# Patient Record
Sex: Male | Born: 1950 | Race: White | Hispanic: No | Marital: Single | Smoking: Never smoker
Health system: Southern US, Community
[De-identification: ages and names within clinical notes are randomized; demographics above are authoritative.]

## PROBLEM LIST (undated history)

## (undated) DIAGNOSIS — I10 Essential (primary) hypertension: Secondary | ICD-10-CM

## (undated) DIAGNOSIS — F32A Depression, unspecified: Secondary | ICD-10-CM

## (undated) DIAGNOSIS — M1990 Unspecified osteoarthritis, unspecified site: Secondary | ICD-10-CM

## (undated) DIAGNOSIS — F329 Major depressive disorder, single episode, unspecified: Secondary | ICD-10-CM

## (undated) DIAGNOSIS — E7800 Pure hypercholesterolemia, unspecified: Secondary | ICD-10-CM

## (undated) HISTORY — PX: KNEE ARTHROSCOPY: SUR90

---

## 2009-04-18 ENCOUNTER — Ambulatory Visit (HOSPITAL_COMMUNITY): Admission: RE | Admit: 2009-04-18 | Discharge: 2009-04-18 | Payer: Self-pay | Admitting: Orthopaedic Surgery

## 2010-10-31 LAB — DIFFERENTIAL
Basophils Absolute: 0 10*3/uL (ref 0.0–0.1)
Basophils Relative: 1 % (ref 0–1)
Eosinophils Absolute: 0.1 10*3/uL (ref 0.0–0.7)
Neutro Abs: 3 10*3/uL (ref 1.7–7.7)
Neutrophils Relative %: 61 % (ref 43–77)

## 2010-10-31 LAB — COMPREHENSIVE METABOLIC PANEL
ALT: 26 U/L (ref 0–53)
Alkaline Phosphatase: 54 U/L (ref 39–117)
BUN: 19 mg/dL (ref 6–23)
CO2: 29 mEq/L (ref 19–32)
Chloride: 106 mEq/L (ref 96–112)
GFR calc non Af Amer: >60 mL/min (ref >60)
Glucose, Bld: 100 mg/dL — ABNORMAL HIGH (ref 70–99)
Potassium: 4.5 mEq/L (ref 3.5–5.1)
Total Bilirubin: 0.7 mg/dL (ref 0.3–1.2)
Total Protein: 6.9 g/dL (ref 6.0–8.3)

## 2010-10-31 LAB — CBC
HCT: 37.3 % — ABNORMAL LOW (ref 39.0–52.0)
Hemoglobin: 13.4 g/dL (ref 13.0–17.0)
RBC: 4.12 MIL/uL — ABNORMAL LOW (ref 4.22–5.81)
RDW: 12.8 % (ref 11.5–15.5)

## 2010-10-31 LAB — URINALYSIS, ROUTINE W REFLEX MICROSCOPIC
Glucose, UA: NEGATIVE mg/dL
Ketones, ur: NEGATIVE mg/dL
Specific Gravity, Urine: <1.005 — ABNORMAL LOW (ref 1.005–1.030)
pH: 5.5 (ref 5.0–8.0)

## 2016-10-15 ENCOUNTER — Ambulatory Visit (INDEPENDENT_AMBULATORY_CARE_PROVIDER_SITE_OTHER): Payer: Medicare Other | Admitting: Orthopaedic Surgery

## 2016-10-15 ENCOUNTER — Encounter (INDEPENDENT_AMBULATORY_CARE_PROVIDER_SITE_OTHER): Payer: Self-pay | Admitting: Orthopaedic Surgery

## 2016-10-15 VITALS — BP 150/99 | HR 62 | Ht 69.0 in | Wt 215.0 lb

## 2016-10-15 DIAGNOSIS — M1712 Unilateral primary osteoarthritis, left knee: Secondary | ICD-10-CM | POA: Diagnosis not present

## 2016-10-15 NOTE — Progress Notes (Signed)
Office Visit Note   Patient: Joshua BroomsRicky S Reyes           Date of Birth: 07-Oct-1950           MRN: [id number] Visit Date: 10/15/2016              Requested by: No referring provider defined for this encounter. PCP: Ignatius Speckinghruv B Vyas, MD   Assessment & Plan: Visit Diagnoses:  1. Unilateral primary osteoarthritis, left knee     Plan: Patient had multiple questions concerning knee osteoarthritis. Had questions about viscous supplement. We discussed recommended treatments for arthritis including Tylenol weightbearing modification use of cane anti-inflammatories intermittent nice occasional injections and Visco supplementation he is alert had previous knee arthroscopy with partial meniscectomy. We discussed the treatment options and he can call sooner as further questions. Previous knee x-rays show bone-on-bone changes consistent with end-stage primary knee osteoarthritis. Total knee arthroplasty was discussed in detail.  Follow-Up Instructions: We discussed treatment options including total knee arthroplasty. He had multiple questions concerning total knee arthroplasty length of time hospital outpatient physical therapy etc.  Orders:  No orders of the defined types were placed in this encounter.  No orders of the defined types were placed in this encounter.     Procedures: No procedures performed   Clinical Data: No additional findings.   Subjective: Chief Complaint  Patient presents with  . Left Knee - Pain    Patient presents with posterior left knee pain. He states that he was seen in the office a little over a year ago and was told he would eventually need a knee replacement. Since that time, he has been taking tumeric and omega complete and that seems to have helped some with the knee pain and swelling. He is now having what he describes as a "toothache in the knee" pain behind the left knee. He notices this mostly after he works a 4 hour shift at [employer]. He is hoping to put  off surgery and is wondering if this could be due to a Bakers Cyst and if there is something that could be recommended for that.   Patient's had pain swelling this knee with activity. Grinding no locking. No chills or fever. He's been using ice after work on a daily basis.  Review of Systems  Constitutional: Negative for chills and diaphoresis.  HENT: Negative for ear discharge, ear pain and nosebleeds.   Eyes: Negative for discharge and visual disturbance.  Respiratory: Negative for cough, choking and shortness of breath.   Cardiovascular: Negative for chest pain and palpitations.  Gastrointestinal: Negative for abdominal distention and abdominal pain.  Endocrine: Negative for cold intolerance and heat intolerance.  Genitourinary: Negative for flank pain and hematuria.  Musculoskeletal:       Positive for left knee pain with the intermittent swelling pain with activities. He is on his feet working at [employer] has pain by the end of his shift. He describes his as a toothache type pain with aching. Negative for fever or chills.  Skin: Negative for rash and wound.  Neurological: Negative for seizures and speech difficulty.  Hematological: Negative for adenopathy. Does not bruise/bleed easily.  Psychiatric/Behavioral: Negative for agitation and suicidal ideas.     Objective: Vital Signs: BP (!) 150/99   Pulse 62   Ht 5\' 9"  (1.753 m)   Wt 215 lb (97.5 kg)   BMI 31.75 kg/m   Physical Exam  Constitutional: He is oriented to person, place, and time. He appears  well-developed and well-nourished.  HENT:  Head: Normocephalic and atraumatic.  Eyes: EOM are normal. Pupils are equal, round, and reactive to light.  Neck: No tracheal deviation present. No thyromegaly present.  Cardiovascular: Normal rate.   Pulmonary/Chest: Effort normal. He has no wheezes.  Abdominal: Soft. Bowel sounds are normal.  Musculoskeletal:  Tenderness at the medial joint line. Crepitus with knee range of motion  to postnasal effusion. Collateral ligaments crucial ligament exam is normal. Hip range of motion normal negative straight leg raising. Lower extremity reflexes are 2+. Dorsalis pedis posterior tibial pulse is intact. Sensory testing lower extremities is normal. Patient may have small Baker's cyst possibly. Negative Homans. Pes bursa hamstrings are normal.  Neurological: He is alert and oriented to person, place, and time.  Skin: Skin is warm and dry. Capillary refill takes less than 2 seconds.  Psychiatric: He has a normal mood and affect. His behavior is normal. Judgment and thought content normal.    Ortho Exam  Specialty Comments:  No specialty comments available.  Imaging: No results found.   PMFS History: There are no active problems to display for this patient.  No past medical history on file.  No family history on file.  No past surgical history on file. Social History   Occupational History  . Not on file.   Social History Main Topics  . Smoking status: Never Smoker  . Smokeless tobacco: Never Used  . Alcohol use No  . Drug use: No  . Sexual activity: Not on file

## 2016-10-19 ENCOUNTER — Telehealth (INDEPENDENT_AMBULATORY_CARE_PROVIDER_SITE_OTHER): Payer: Self-pay | Admitting: Orthopaedic Surgery

## 2016-10-19 NOTE — Telephone Encounter (Signed)
Tried calling pt to schedule surgery. Phone states he is available and no option to leave VM. Will try to call pt again at a later time.

## 2016-10-20 ENCOUNTER — Telehealth (INDEPENDENT_AMBULATORY_CARE_PROVIDER_SITE_OTHER): Payer: Self-pay | Admitting: Orthopaedic Surgery

## 2016-10-20 NOTE — Telephone Encounter (Signed)
Tried contacting pt again to schedule surgery. Phone does not allow me to leave a VM. Will try pt again at a later time.

## 2016-10-21 ENCOUNTER — Other Ambulatory Visit (INDEPENDENT_AMBULATORY_CARE_PROVIDER_SITE_OTHER): Payer: Self-pay | Admitting: Orthopaedic Surgery

## 2016-10-21 DIAGNOSIS — M1712 Unilateral primary osteoarthritis, left knee: Secondary | ICD-10-CM

## 2016-10-29 ENCOUNTER — Encounter (HOSPITAL_COMMUNITY): Payer: Self-pay

## 2016-10-29 ENCOUNTER — Ambulatory Visit (HOSPITAL_COMMUNITY)
Admission: RE | Admit: 2016-10-29 | Discharge: 2016-10-29 | Disposition: A | Discharge status: Home / Self Care | Payer: Medicare Other | Source: Ambulatory Visit | Attending: Orthopaedic Surgery | Admitting: Orthopaedic Surgery

## 2016-10-29 ENCOUNTER — Encounter (HOSPITAL_COMMUNITY)
Admission: RE | Admit: 2016-10-29 | Discharge: 2016-10-29 | Disposition: A | Discharge status: Home / Self Care | Payer: Medicare Other | Source: Ambulatory Visit | Attending: Orthopaedic Surgery | Admitting: Orthopaedic Surgery

## 2016-10-29 DIAGNOSIS — Z419 Encounter for procedure for purposes other than remedying health state, unspecified: Secondary | ICD-10-CM

## 2016-10-29 DIAGNOSIS — Z01812 Encounter for preprocedural laboratory examination: Secondary | ICD-10-CM | POA: Diagnosis not present

## 2016-10-29 DIAGNOSIS — I10 Essential (primary) hypertension: Secondary | ICD-10-CM | POA: Insufficient documentation

## 2016-10-29 DIAGNOSIS — Z01818 Encounter for other preprocedural examination: Secondary | ICD-10-CM | POA: Insufficient documentation

## 2016-10-29 DIAGNOSIS — M1990 Unspecified osteoarthritis, unspecified site: Secondary | ICD-10-CM | POA: Insufficient documentation

## 2016-10-29 DIAGNOSIS — Z01810 Encounter for preprocedural cardiovascular examination: Secondary | ICD-10-CM | POA: Diagnosis not present

## 2016-10-29 DIAGNOSIS — R001 Bradycardia, unspecified: Secondary | ICD-10-CM | POA: Diagnosis not present

## 2016-10-29 DIAGNOSIS — F329 Major depressive disorder, single episode, unspecified: Secondary | ICD-10-CM | POA: Diagnosis not present

## 2016-10-29 DIAGNOSIS — I700 Atherosclerosis of aorta: Secondary | ICD-10-CM | POA: Diagnosis not present

## 2016-10-29 HISTORY — DX: Pure hypercholesterolemia, unspecified: E78.00

## 2016-10-29 HISTORY — DX: Unspecified osteoarthritis, unspecified site: M19.90

## 2016-10-29 HISTORY — DX: Depression, unspecified: F32.A

## 2016-10-29 HISTORY — DX: Essential (primary) hypertension: I10

## 2016-10-29 HISTORY — DX: Major depressive disorder, single episode, unspecified: F32.9

## 2016-10-29 LAB — URINALYSIS, ROUTINE W REFLEX MICROSCOPIC
Bilirubin Urine: NEGATIVE
Glucose, UA: NEGATIVE mg/dL
HGB URINE DIPSTICK: NEGATIVE
Ketones, ur: NEGATIVE mg/dL
Leukocytes, UA: NEGATIVE
NITRITE: NEGATIVE
PROTEIN: NEGATIVE mg/dL
Specific Gravity, Urine: 1.015 (ref 1.005–1.030)
pH: 5 (ref 5.0–8.0)

## 2016-10-29 LAB — CBC
HCT: 41.4 % (ref 39.0–52.0)
Hemoglobin: 14.4 g/dL (ref 13.0–17.0)
MCH: 31.2 pg (ref 26.0–34.0)
MCHC: 34.8 g/dL (ref 30.0–36.0)
MCV: 89.8 fL (ref 78.0–100.0)
PLATELETS: 197 10*3/uL (ref 150–400)
RBC: 4.61 MIL/uL (ref 4.22–5.81)
RDW: 12.5 % (ref 11.5–15.5)
WBC: 4.9 10*3/uL (ref 4.0–10.5)

## 2016-10-29 LAB — COMPREHENSIVE METABOLIC PANEL
ALBUMIN: 4 g/dL (ref 3.5–5.0)
ALT: 29 U/L (ref 17–63)
AST: 22 U/L (ref 15–41)
Alkaline Phosphatase: 57 U/L (ref 38–126)
Anion gap: 8 (ref 5–15)
BUN: 13 mg/dL (ref 6–20)
CHLORIDE: 107 mmol/L (ref 101–111)
CO2: 24 mmol/L (ref 22–32)
CREATININE: 0.79 mg/dL (ref 0.61–1.24)
Calcium: 9.5 mg/dL (ref 8.9–10.3)
GFR calc Af Amer: >60 mL/min (ref >60)
GLUCOSE: 128 mg/dL — AB (ref 65–99)
POTASSIUM: 4.1 mmol/L (ref 3.5–5.1)
Sodium: 139 mmol/L (ref 135–145)
TOTAL PROTEIN: 7.1 g/dL (ref 6.5–8.1)
Total Bilirubin: 0.7 mg/dL (ref 0.3–1.2)

## 2016-10-29 LAB — SURGICAL PCR SCREEN
MRSA, PCR: NEGATIVE
STAPHYLOCOCCUS AUREUS: NEGATIVE

## 2016-10-29 NOTE — Pre-Procedure Instructions (Signed)
    Joshua Reyes  10/29/2016     Your procedure is scheduled on Wednesday, April 11th, 2018.  Report to [facility] Admitting at 10:30 A.M.   Call this number if you have problems the morning of surgery:  [phone]   Remember:  Do not eat food or drink liquids after midnight.   Take these medicines the morning of surgery with A SIP OF WATER: Atenolol (Tenormin), Fluoxetine (Prozac).  Stop taking: Aspirin, NSAIDS, Aleve, Naproxen, Ibuprofen, Advil, Motrin, BC's, Goody's, Fish oil, all herbal medications, and all vitamins.    Do not wear jewelry.  Do not wear lotions, powders, or colognes, or deoderant.  Men may shave face and neck.  Do not bring valuables to the hospital.   [facility] is not responsible for any belongings or valuables.  Contacts, dentures or bridgework may not be worn into surgery.  Leave your suitcase in the car.  After surgery it may be brought to your room.  For patients admitted to the hospital, discharge time will be determined by your treatment team.  Patients discharged the day of surgery will not be allowed to drive home.   Special instructions:  Preparing for Surgery.   [facility]- Preparing For Surgery  Before surgery, you can play an important role. Because skin is not sterile, your skin needs to be as free of germs as possible. You can reduce the number of germs on your skin by washing with CHG (chlorahexidine gluconate) Soap before surgery.  CHG is an antiseptic cleaner which kills germs and bonds with the skin to continue killing germs even after washing.  Please do not use if you have an allergy to CHG or antibacterial soaps. If your skin becomes reddened/irritated stop using the CHG.  Do not shave (including legs and underarms) for at least 48 hours prior to first CHG shower. It is OK to shave your face.  Please follow these instructions carefully.   1. Shower the NIGHT BEFORE SURGERY and the MORNING OF SURGERY with CHG.    2. If you chose to wash your hair, wash your hair first as usual with your normal shampoo.  3. After you shampoo, rinse your hair and body thoroughly to remove the shampoo.  4. Use CHG as you would any other liquid soap. You can apply CHG directly to the skin and wash gently with a scrungie or a clean washcloth.   5. Apply the CHG Soap to your body ONLY FROM THE NECK DOWN.  Do not use on open wounds or open sores. Avoid contact with your eyes, ears, mouth and genitals (private parts). Wash genitals (private parts) with your normal soap.  6. Wash thoroughly, paying special attention to the area where your surgery will be performed.  7. Thoroughly rinse your body with warm water from the neck down.  8. DO NOT shower/wash with your normal soap after using and rinsing off the CHG Soap.  9. Pat yourself dry with a CLEAN TOWEL.   10. Wear CLEAN PAJAMAS   11. Place CLEAN SHEETS on your bed the night of your first shower and DO NOT SLEEP WITH PETS.  Day of Surgery: Do not apply any deodorants/lotions. Please wear clean clothes to the hospital/surgery center.    Please read over the following fact sheets that you were given. MRSA Information

## 2016-11-03 MED ORDER — CEFAZOLIN SODIUM-DEXTROSE 2-4 GM/100ML-% IV SOLN
2.0000 g | INTRAVENOUS | Status: AC
  Administered 2016-11-04: 2 g via INTRAVENOUS
  Filled 2016-11-03: qty 100

## 2016-11-04 ENCOUNTER — Encounter (HOSPITAL_COMMUNITY): Payer: Self-pay | Admitting: *Deleted

## 2016-11-04 ENCOUNTER — Encounter (HOSPITAL_COMMUNITY)
Admission: RE | Disposition: A | Discharge status: Skilled Nursing Facility | Payer: Self-pay | Source: Ambulatory Visit | Attending: Orthopaedic Surgery

## 2016-11-04 ENCOUNTER — Inpatient Hospital Stay (HOSPITAL_COMMUNITY): Payer: Medicare Other

## 2016-11-04 ENCOUNTER — Inpatient Hospital Stay (HOSPITAL_COMMUNITY): Payer: Medicare Other | Admitting: Anesthesiology

## 2016-11-04 ENCOUNTER — Inpatient Hospital Stay (HOSPITAL_COMMUNITY)
Admission: RE | Admit: 2016-11-04 | Discharge: 2016-11-06 | DRG: 470 | Disposition: A | Discharge status: Skilled Nursing Facility | Payer: Medicare Other | Source: Ambulatory Visit | Attending: Orthopaedic Surgery | Admitting: Orthopaedic Surgery

## 2016-11-04 ENCOUNTER — Other Ambulatory Visit (HOSPITAL_COMMUNITY): Payer: Self-pay | Admitting: *Deleted

## 2016-11-04 DIAGNOSIS — Z79899 Other long term (current) drug therapy: Secondary | ICD-10-CM

## 2016-11-04 DIAGNOSIS — E7800 Pure hypercholesterolemia, unspecified: Secondary | ICD-10-CM | POA: Diagnosis present

## 2016-11-04 DIAGNOSIS — F329 Major depressive disorder, single episode, unspecified: Secondary | ICD-10-CM | POA: Diagnosis present

## 2016-11-04 DIAGNOSIS — I10 Essential (primary) hypertension: Secondary | ICD-10-CM | POA: Diagnosis present

## 2016-11-04 DIAGNOSIS — M1712 Unilateral primary osteoarthritis, left knee: Principal | ICD-10-CM | POA: Diagnosis present

## 2016-11-04 DIAGNOSIS — Z09 Encounter for follow-up examination after completed treatment for conditions other than malignant neoplasm: Secondary | ICD-10-CM

## 2016-11-04 HISTORY — PX: TOTAL KNEE ARTHROPLASTY: SHX125

## 2016-11-04 SURGERY — ARTHROPLASTY, KNEE, TOTAL
Anesthesia: Spinal | Site: Knee | Laterality: Left

## 2016-11-04 MED ORDER — GABAPENTIN 300 MG PO CAPS
600.0000 mg | ORAL_CAPSULE | Freq: Once | ORAL | Status: AC
  Administered 2016-11-04: 600 mg via ORAL

## 2016-11-04 MED ORDER — ONDANSETRON HCL 4 MG/2ML IJ SOLN
INTRAMUSCULAR | Status: AC
  Filled 2016-11-04: qty 2

## 2016-11-04 MED ORDER — BUPIVACAINE HCL (PF) 0.25 % IJ SOLN
INTRAMUSCULAR | Status: DC | PRN
Start: 2016-11-04 — End: 2016-11-04
  Administered 2016-11-04: 20 mL

## 2016-11-04 MED ORDER — ONDANSETRON HCL 4 MG PO TABS: 4.0000 mg | ORAL_TABLET | Freq: Four times a day (QID) | ORAL | Status: DC | PRN

## 2016-11-04 MED ORDER — PROPOFOL 500 MG/50ML IV EMUL
INTRAVENOUS | Status: DC | PRN
  Administered 2016-11-04: 75 ug/kg/min via INTRAVENOUS

## 2016-11-04 MED ORDER — 0.9 % SODIUM CHLORIDE (POUR BTL) OPTIME
TOPICAL | Status: DC | PRN
  Administered 2016-11-04: 1000 mL

## 2016-11-04 MED ORDER — CHLORHEXIDINE GLUCONATE 4 % EX LIQD: 60.0000 mL | Freq: Once | CUTANEOUS | Status: DC

## 2016-11-04 MED ORDER — ACETAMINOPHEN 500 MG PO TABS
ORAL_TABLET | ORAL | Status: AC
  Administered 2016-11-04: 1000 mg via ORAL
  Filled 2016-11-04: qty 2

## 2016-11-04 MED ORDER — FENTANYL CITRATE (PF) 100 MCG/2ML IJ SOLN
INTRAMUSCULAR | Status: AC
  Administered 2016-11-04: 100 ug via INTRAVENOUS
  Filled 2016-11-04: qty 2

## 2016-11-04 MED ORDER — FENTANYL CITRATE (PF) 100 MCG/2ML IJ SOLN
100.0000 ug | Freq: Once | INTRAMUSCULAR | Status: AC
  Administered 2016-11-04: 100 ug via INTRAVENOUS

## 2016-11-04 MED ORDER — ACETAMINOPHEN 500 MG PO TABS
1000.0000 mg | ORAL_TABLET | Freq: Once | ORAL | Status: AC
  Administered 2016-11-04: 1000 mg via ORAL
  Filled 2016-11-04: qty 2

## 2016-11-04 MED ORDER — CEFAZOLIN SODIUM-DEXTROSE 2-4 GM/100ML-% IV SOLN: 2.0000 g | INTRAVENOUS | Status: DC

## 2016-11-04 MED ORDER — BUPIVACAINE IN DEXTROSE 0.75-8.25 % IT SOLN
INTRATHECAL | Status: DC | PRN
  Administered 2016-11-04: 15 mg via INTRATHECAL

## 2016-11-04 MED ORDER — PHENOL 1.4 % MT LIQD: 1.0000 | OROMUCOSAL | Status: DC | PRN

## 2016-11-04 MED ORDER — SODIUM CHLORIDE 0.9 % IR SOLN
Status: DC | PRN
  Administered 2016-11-04: 1000 mL

## 2016-11-04 MED ORDER — HYDROMORPHONE HCL 1 MG/ML IJ SOLN
0.5000 mg | INTRAMUSCULAR | Status: DC | PRN
  Filled 2016-11-04: qty 1

## 2016-11-04 MED ORDER — OXYCODONE HCL 5 MG PO TABS
5.0000 mg | ORAL_TABLET | ORAL | Status: DC | PRN
  Administered 2016-11-04: 5 mg via ORAL
  Administered 2016-11-05 – 2016-11-06 (×8): 10 mg via ORAL
  Filled 2016-11-04 (×4): qty 2
  Filled 2016-11-04: qty 1
  Filled 2016-11-04 (×5): qty 2

## 2016-11-04 MED ORDER — PHENYLEPHRINE HCL 10 MG/ML IJ SOLN
INTRAMUSCULAR | Status: AC
  Filled 2016-11-04: qty 1

## 2016-11-04 MED ORDER — FLUOXETINE HCL 20 MG PO CAPS
20.0000 mg | ORAL_CAPSULE | Freq: Every day | ORAL | Status: DC
  Administered 2016-11-05 – 2016-11-06 (×2): 20 mg via ORAL
  Filled 2016-11-04 (×2): qty 1

## 2016-11-04 MED ORDER — BUPIVACAINE HCL (PF) 0.25 % IJ SOLN
INTRAMUSCULAR | Status: AC
  Filled 2016-11-04: qty 30

## 2016-11-04 MED ORDER — CELECOXIB 200 MG PO CAPS
200.0000 mg | ORAL_CAPSULE | Freq: Once | ORAL | Status: AC
  Administered 2016-11-04: 200 mg via ORAL

## 2016-11-04 MED ORDER — TRANEXAMIC ACID 1000 MG/10ML IV SOLN
1000.0000 mg | INTRAVENOUS | Status: AC
  Administered 2016-11-04: 1000 mg via INTRAVENOUS
  Filled 2016-11-04: qty 10

## 2016-11-04 MED ORDER — SIMVASTATIN 20 MG PO TABS
20.0000 mg | ORAL_TABLET | Freq: Every day | ORAL | Status: DC
  Administered 2016-11-04 – 2016-11-06 (×3): 20 mg via ORAL
  Filled 2016-11-04 (×3): qty 1

## 2016-11-04 MED ORDER — ONDANSETRON HCL 4 MG/2ML IJ SOLN
INTRAMUSCULAR | Status: DC | PRN
  Administered 2016-11-04: 4 mg via INTRAVENOUS

## 2016-11-04 MED ORDER — ONDANSETRON HCL 4 MG/2ML IJ SOLN: 4.0000 mg | Freq: Four times a day (QID) | INTRAMUSCULAR | Status: DC | PRN

## 2016-11-04 MED ORDER — ROPIVACAINE HCL 5 MG/ML IJ SOLN
INTRAMUSCULAR | Status: DC | PRN
  Administered 2016-11-04: 25 mL via PERINEURAL

## 2016-11-04 MED ORDER — LACTATED RINGERS IV SOLN
INTRAVENOUS | Status: DC
  Administered 2016-11-04 (×2): via INTRAVENOUS

## 2016-11-04 MED ORDER — ASPIRIN EC 325 MG PO TBEC
325.0000 mg | DELAYED_RELEASE_TABLET | Freq: Every day | ORAL | Status: DC
  Administered 2016-11-05 – 2016-11-06 (×2): 325 mg via ORAL
  Filled 2016-11-04 (×2): qty 1

## 2016-11-04 MED ORDER — METOCLOPRAMIDE HCL 5 MG/ML IJ SOLN: 5.0000 mg | Freq: Three times a day (TID) | INTRAMUSCULAR | Status: DC | PRN

## 2016-11-04 MED ORDER — DEXTROSE 5 % IV SOLN
500.0000 mg | Freq: Four times a day (QID) | INTRAVENOUS | Status: DC | PRN
  Filled 2016-11-04: qty 5

## 2016-11-04 MED ORDER — DOCUSATE SODIUM 100 MG PO CAPS
100.0000 mg | ORAL_CAPSULE | Freq: Two times a day (BID) | ORAL | Status: DC
Start: 2016-11-04 — End: 2016-11-07
  Administered 2016-11-04 – 2016-11-06 (×5): 100 mg via ORAL
  Filled 2016-11-04 (×5): qty 1

## 2016-11-04 MED ORDER — METOCLOPRAMIDE HCL 5 MG PO TABS: 5.0000 mg | ORAL_TABLET | Freq: Three times a day (TID) | ORAL | Status: DC | PRN

## 2016-11-04 MED ORDER — MIDAZOLAM HCL 2 MG/2ML IJ SOLN
2.0000 mg | Freq: Once | INTRAMUSCULAR | Status: AC
  Administered 2016-11-04: 2 mg via INTRAVENOUS

## 2016-11-04 MED ORDER — MENTHOL 3 MG MT LOZG: 1.0000 | LOZENGE | OROMUCOSAL | Status: DC | PRN

## 2016-11-04 MED ORDER — SODIUM CHLORIDE 0.9 % IV SOLN
INTRAVENOUS | Status: DC
  Administered 2016-11-04 – 2016-11-05 (×2): via INTRAVENOUS

## 2016-11-04 MED ORDER — METHOCARBAMOL 500 MG PO TABS
500.0000 mg | ORAL_TABLET | Freq: Four times a day (QID) | ORAL | Status: DC | PRN
Start: 2016-11-04 — End: 2016-11-07
  Administered 2016-11-05: 500 mg via ORAL
  Filled 2016-11-04: qty 1

## 2016-11-04 MED ORDER — TRANEXAMIC ACID 1000 MG/10ML IV SOLN
2000.0000 mg | INTRAVENOUS | Status: DC
  Filled 2016-11-04: qty 20

## 2016-11-04 MED ORDER — ATENOLOL 50 MG PO TABS
25.0000 mg | ORAL_TABLET | Freq: Every day | ORAL | Status: DC
  Administered 2016-11-05 – 2016-11-06 (×2): 25 mg via ORAL
  Filled 2016-11-04 (×2): qty 1

## 2016-11-04 MED ORDER — GABAPENTIN 300 MG PO CAPS
ORAL_CAPSULE | ORAL | Status: AC
  Administered 2016-11-04: 600 mg via ORAL
  Filled 2016-11-04: qty 2

## 2016-11-04 MED ORDER — PHENYLEPHRINE HCL 10 MG/ML IJ SOLN
INTRAVENOUS | Status: DC | PRN
  Administered 2016-11-04: 20 ug/min via INTRAVENOUS

## 2016-11-04 MED ORDER — POLYETHYLENE GLYCOL 3350 17 G PO PACK
17.0000 g | PACK | Freq: Every day | ORAL | Status: DC | PRN
Start: 2016-11-04 — End: 2016-11-07

## 2016-11-04 MED ORDER — ACETAMINOPHEN 325 MG PO TABS: 650.0000 mg | ORAL_TABLET | Freq: Four times a day (QID) | ORAL | Status: DC | PRN

## 2016-11-04 MED ORDER — BUPIVACAINE LIPOSOME 1.3 % IJ SUSP
20.0000 mL | INTRAMUSCULAR | Status: AC
  Administered 2016-11-04: 20 mL
  Filled 2016-11-04: qty 20

## 2016-11-04 MED ORDER — CELECOXIB 200 MG PO CAPS
ORAL_CAPSULE | ORAL | Status: AC
  Administered 2016-11-04: 200 mg via ORAL
  Filled 2016-11-04: qty 1

## 2016-11-04 MED ORDER — HYDROMORPHONE HCL 1 MG/ML IJ SOLN: 0.2500 mg | INTRAMUSCULAR | Status: DC | PRN

## 2016-11-04 MED ORDER — DEXAMETHASONE SODIUM PHOSPHATE 10 MG/ML IJ SOLN
INTRAMUSCULAR | Status: AC
  Filled 2016-11-04: qty 1

## 2016-11-04 MED ORDER — PROPOFOL 10 MG/ML IV BOLUS
INTRAVENOUS | Status: DC | PRN
Start: 2016-11-04 — End: 2016-11-04
  Administered 2016-11-04: 30 mg via INTRAVENOUS

## 2016-11-04 MED ORDER — ACETAMINOPHEN 650 MG RE SUPP: 650.0000 mg | Freq: Four times a day (QID) | RECTAL | Status: DC | PRN

## 2016-11-04 MED ORDER — DEXAMETHASONE SODIUM PHOSPHATE 10 MG/ML IJ SOLN
INTRAMUSCULAR | Status: DC | PRN
  Administered 2016-11-04: 10 mg via INTRAVENOUS

## 2016-11-04 MED ORDER — MIDAZOLAM HCL 2 MG/2ML IJ SOLN
INTRAMUSCULAR | Status: AC
  Administered 2016-11-04: 2 mg via INTRAVENOUS
  Filled 2016-11-04: qty 2

## 2016-11-04 SURGICAL SUPPLY — 64 items
BANDAGE ACE 4X5 VEL STRL LF (GAUZE/BANDAGES/DRESSINGS) ×3 IMPLANT
BANDAGE ELASTIC 4 VELCRO ST LF (GAUZE/BANDAGES/DRESSINGS) ×3 IMPLANT
BANDAGE ESMARK 6X9 LF (GAUZE/BANDAGES/DRESSINGS) ×1 IMPLANT
BENZOIN TINCTURE PRP APPL 2/3 (GAUZE/BANDAGES/DRESSINGS) ×3 IMPLANT
BLADE SAGITTAL 25.0X1.19X90 (BLADE) ×2 IMPLANT
BLADE SAGITTAL 25.0X1.19X90MM (BLADE) ×1
BLADE SAW SGTL 13X75X1.27 (BLADE) ×3 IMPLANT
BNDG ELASTIC 6X10 VLCR STRL LF (GAUZE/BANDAGES/DRESSINGS) ×3 IMPLANT
BNDG ESMARK 6X9 LF (GAUZE/BANDAGES/DRESSINGS) ×3
BOWL SMART MIX CTS (DISPOSABLE) ×3 IMPLANT
CAPT KNEE TOTAL 3 ATTUNE ×3 IMPLANT
CEMENT HV SMART SET (Cement) ×6 IMPLANT
CLOSURE WOUND 1/2 X4 (GAUZE/BANDAGES/DRESSINGS) ×2
COVER SURGICAL LIGHT HANDLE (MISCELLANEOUS) ×3 IMPLANT
CUFF TOURNIQUET SINGLE 34IN LL (TOURNIQUET CUFF) ×3 IMPLANT
CUFF TOURNIQUET SINGLE 44IN (TOURNIQUET CUFF) IMPLANT
DRAPE ORTHO SPLIT 77X108 STRL (DRAPES) ×6
DRAPE SURG ORHT 6 SPLT 77X108 (DRAPES) ×3 IMPLANT
DRAPE U-SHAPE 47X51 STRL (DRAPES) ×3 IMPLANT
DRSG PAD ABDOMINAL 8X10 ST (GAUZE/BANDAGES/DRESSINGS) ×6 IMPLANT
DURAPREP 26ML APPLICATOR (WOUND CARE) ×3 IMPLANT
ELECT REM PT RETURN 9FT ADLT (ELECTROSURGICAL) ×3
ELECTRODE REM PT RTRN 9FT ADLT (ELECTROSURGICAL) ×1 IMPLANT
EVACUATOR 1/8 PVC DRAIN (DRAIN) IMPLANT
FACESHIELD WRAPAROUND (MASK) ×3 IMPLANT
GAUZE SPONGE 4X4 12PLY STRL (GAUZE/BANDAGES/DRESSINGS) ×3 IMPLANT
GAUZE XEROFORM 5X9 LF (GAUZE/BANDAGES/DRESSINGS) ×3 IMPLANT
GLOVE BIOGEL PI IND STRL 8 (GLOVE) ×2 IMPLANT
GLOVE BIOGEL PI INDICATOR 8 (GLOVE) ×4
GLOVE ORTHO TXT STRL SZ7.5 (GLOVE) ×6 IMPLANT
GOWN STRL REUS W/ TWL LRG LVL3 (GOWN DISPOSABLE) ×1 IMPLANT
GOWN STRL REUS W/ TWL XL LVL3 (GOWN DISPOSABLE) ×1 IMPLANT
GOWN STRL REUS W/TWL 2XL LVL3 (GOWN DISPOSABLE) ×3 IMPLANT
GOWN STRL REUS W/TWL LRG LVL3 (GOWN DISPOSABLE) ×2
GOWN STRL REUS W/TWL XL LVL3 (GOWN DISPOSABLE) ×2
HANDPIECE INTERPULSE COAX TIP (DISPOSABLE) ×2
IMMOBILIZER KNEE 22 UNIV (SOFTGOODS) ×3 IMPLANT
KIT BASIN OR (CUSTOM PROCEDURE TRAY) ×3 IMPLANT
KIT ROOM TURNOVER OR (KITS) ×3 IMPLANT
MANIFOLD NEPTUNE II (INSTRUMENTS) ×3 IMPLANT
MARKER SKIN DUAL TIP RULER LAB (MISCELLANEOUS) ×3 IMPLANT
NEEDLE HYPO 25GX1X1/2 BEV (NEEDLE) ×3 IMPLANT
NS IRRIG 1000ML POUR BTL (IV SOLUTION) ×3 IMPLANT
PACK TOTAL JOINT (CUSTOM PROCEDURE TRAY) ×3 IMPLANT
PAD ARMBOARD 7.5X6 YLW CONV (MISCELLANEOUS) ×6 IMPLANT
PAD CAST 4YDX4 CTTN HI CHSV (CAST SUPPLIES) ×1 IMPLANT
PADDING CAST COTTON 4X4 STRL (CAST SUPPLIES) ×2
PADDING CAST COTTON 6X4 STRL (CAST SUPPLIES) ×3 IMPLANT
SET HNDPC FAN SPRY TIP SCT (DISPOSABLE) ×1 IMPLANT
STAPLER VISISTAT 35W (STAPLE) ×3 IMPLANT
STRIP CLOSURE SKIN 1/2X4 (GAUZE/BANDAGES/DRESSINGS) ×4 IMPLANT
SUCTION FRAZIER HANDLE 10FR (MISCELLANEOUS) ×2
SUCTION TUBE FRAZIER 10FR DISP (MISCELLANEOUS) ×1 IMPLANT
SUT VIC AB 0 CT1 27 (SUTURE) ×2
SUT VIC AB 0 CT1 27XBRD ANBCTR (SUTURE) ×1 IMPLANT
SUT VIC AB 1 CTX 36 (SUTURE) ×4
SUT VIC AB 1 CTX36XBRD ANBCTR (SUTURE) ×2 IMPLANT
SUT VIC AB 2-0 CT1 27 (SUTURE) ×4
SUT VIC AB 2-0 CT1 TAPERPNT 27 (SUTURE) ×2 IMPLANT
SUT VIC AB 3-0 X1 27 (SUTURE) ×3 IMPLANT
SYR CONTROL 10ML LL (SYRINGE) ×3 IMPLANT
TOWEL OR 17X24 6PK STRL BLUE (TOWEL DISPOSABLE) ×3 IMPLANT
TOWEL OR 17X26 10 PK STRL BLUE (TOWEL DISPOSABLE) ×3 IMPLANT
TRAY CATH 16FR W/PLASTIC CATH (SET/KITS/TRAYS/PACK) ×3 IMPLANT

## 2016-11-04 NOTE — Interval H&P Note (Signed)
History and Physical Interval Note:  11/04/2016 2:30 PM  Joshua Reyes  has presented today for surgery, with the diagnosis of LEFT KNEE OSTEOARTHRITIS  The various methods of treatment have been discussed with the patient and family. After consideration of risks, benefits and other options for treatment, the patient has consented to  Procedure(s): LEFT TOTAL KNEE ARTHROPLASTY (Left) as a surgical intervention .  The patient's history has been reviewed, patient examined, no change in status, stable for surgery.  I have reviewed the patient's chart and labs.  Questions were answered to the patient's satisfaction.     Eldred Manges

## 2016-11-04 NOTE — Progress Notes (Signed)
Orthopedic Tech Progress Note Patient Details:  Joshua Reyes Dec 30, 1950 [id number]  CPM Left Knee CPM Left Knee: On Left Knee Flexion (Degrees): 90 Left Knee Extension (Degrees): 0 Additional Comments: applied cpm to pt left knee leg and pt tolerated well.  left knee   [name] 11/04/2016, 5:39 PM

## 2016-11-04 NOTE — Anesthesia Procedure Notes (Signed)
Procedure Name: MAC Date/Time: 11/04/2016 1:04 PM Performed by: Kyung Rudd Pre-anesthesia Checklist: Patient identified, Emergency Drugs available, Suction available and Patient being monitored Patient Re-evaluated:Patient Re-evaluated prior to inductionOxygen Delivery Method: Nasal cannula Intubation Type: IV induction Placement Confirmation: positive ETCO2 Dental Injury: Teeth and Oropharynx as per pre-operative assessment

## 2016-11-04 NOTE — Anesthesia Procedure Notes (Signed)
Anesthesia Regional Block: Adductor canal block   Pre-Anesthetic Checklist: ,, timeout performed, Correct Patient, Correct Site, Correct Laterality, Correct Procedure, Correct Position, site marked, Risks and benefits discussed, pre-op evaluation,  At surgeon's request and post-op pain management  Laterality: Left  Prep: Maximum Sterile Barrier Precautions used, chloraprep       Needles:  Injection technique: Single-shot  Needle Type: Echogenic Stimulator Needle     Needle Length: 9cm  Needle Gauge: 21     Additional Needles:   Procedures: ultrasound guided,,,,,,,,  Narrative:  Start time: 11/04/2016 11:34 AM End time: 11/04/2016 11:44 AM Injection made incrementally with aspirations every 5 mL. Anesthesiologist: Gaynelle Adu  Additional Notes: 2% Lidocaine skin wheel.

## 2016-11-04 NOTE — Anesthesia Postprocedure Evaluation (Signed)
Anesthesia Post Note  Patient: Joshua Reyes  Procedure(s) Performed: Procedure(s) (LRB): LEFT TOTAL KNEE ARTHROPLASTY (Left)  Patient location during evaluation: PACU Anesthesia Type: Spinal and Regional Level of consciousness: awake and alert Pain management: pain level controlled Vital Signs Assessment: post-procedure vital signs reviewed and stable Respiratory status: spontaneous breathing and respiratory function stable Cardiovascular status: blood pressure returned to baseline and stable Postop Assessment: spinal receding Anesthetic complications: no       Last Vitals:  Vitals:   11/04/16 1545 11/04/16 1600  BP: 132/80 (!) 127/91  Pulse: (!) 51 (!) 49  Resp: 13 11  Temp:      Last Pain:  Vitals:   11/04/16 1600  TempSrc:   PainSc: 0-No pain                 Joli Koob,W. EDMOND

## 2016-11-04 NOTE — H&P (Signed)
TOTAL KNEE ADMISSION H&P  Patient is being admitted for left total knee arthroplasty.  Subjective:  Chief Complaint:left knee pain.  HPI: Joshua Reyes, 66 y.o. male, has a history of pain and functional disability in the left knee due to arthritis and has failed non-surgical conservative treatments for greater than 12 weeks to includeNSAID's and/or analgesics, corticosteriod injections, use of assistive devices, weight reduction as appropriate and activity modification.  Onset of symptoms was gradual, starting 10 years ago with gradually worsening course since that time. Patient currently rates pain in the left knee(s) at 10 out of 10 with activity. Patient has pain that interferes with activities of daily living, crepitus and joint swelling.  Patient has evidence of subchondral sclerosis, periarticular osteophytes, joint subluxation and joint space narrowing by imaging studies.   There are no active problems to display for this patient.  Past Medical History:  Diagnosis Date  . Arthritis   . Depression   . Hypercholesteremia   . Hypertension     Past Surgical History:  Procedure Laterality Date  . KNEE ARTHROSCOPY Left     Prescriptions Prior to Admission  Medication Sig Dispense Refill Last Dose  . atenolol (TENORMIN) 25 MG tablet Take 25 mg by mouth daily.  3 11/04/2016 at Unknown time  . FLUoxetine (PROZAC) 20 MG capsule Take 20 mg by mouth daily.  4 11/04/2016 at Unknown time  . ibuprofen (ADVIL,MOTRIN) 200 MG tablet Take 200 mg by mouth every 6 (six) hours as needed for mild pain.   Past Week at Unknown time  . simvastatin (ZOCOR) 20 MG tablet Take 20 mg by mouth daily.  4 11/03/2016 at Unknown time  . TURMERIC PO Take 1,000 mg by mouth daily.   Past Week at Unknown time   Allergies  Allergen Reactions  . No Known Allergies     Social History  Substance Use Topics  . Smoking status: Never Smoker  . Smokeless tobacco: Never Used  . Alcohol use No    History reviewed. No  pertinent family history.   Review of Systems  Constitutional: Negative.   HENT: Negative.   Eyes: Negative.   Respiratory: Negative.   Cardiovascular: Negative.   Gastrointestinal: Negative.   Genitourinary: Negative.   Musculoskeletal: Positive for joint pain.  Skin: Negative.   Neurological: Negative.   Psychiatric/Behavioral: Negative.     Objective:  Physical Exam  Constitutional: He is oriented to person, place, and time. No distress.  HENT:  Head: Normocephalic and atraumatic.  Eyes: EOM are normal. Pupils are equal, round, and reactive to light.  Respiratory: No respiratory distress.  GI: He exhibits no distension.  Musculoskeletal:  Positive effusion. Joint line tender. Positive crepitus.  Neurological: He is alert and oriented to person, place, and time.  Skin: Skin is warm and dry.  Psychiatric: He has a normal mood and affect.    Vital signs in last 24 hours: Temp:  [98 F (36.7 C)] 98 F (36.7 C) (04/11 1047) Pulse Rate:  [54-58] 58 (04/11 1150) Resp:  [8-20] 11 (04/11 1150) BP: (141-217)/(57-100) 155/93 (04/11 1150) SpO2:  [94 %-100 %] 98 % (04/11 1150) Weight:  [213 lb (96.6 kg)] 213 lb (96.6 kg) (04/11 1052)  Labs:   Estimated body mass index is 31.45 kg/m as calculated from the following:   Height as of this encounter:  (1.753 m).   Weight as of this encounter: 213 lb (96.6 kg).   Imaging Review Plain radiographs demonstrate moderate degenerative joint disease of  the left knee(s). The overall alignment ismild varus. The bone quality appears to be good for age and reported activity level.  Assessment/Plan:  End stage arthritis, left knee   The patient history, physical examination, clinical judgment of the provider and imaging studies are consistent with end stage degenerative joint disease of the left knee(s) and total knee arthroplasty is deemed medically necessary. The treatment options including medical management, injection therapy  arthroscopy and arthroplasty were discussed at length. The risks and benefits of total knee arthroplasty were presented and reviewed. The risks due to aseptic loosening, infection, stiffness, patella tracking problems, thromboembolic complications and other imponderables were discussed. The patient acknowledged the explanation, agreed to proceed with the plan and consent was signed. Patient is being admitted for inpatient treatment for surgery, pain control, PT, OT, prophylactic antibiotics, VTE prophylaxis, progressive ambulation and ADL's and discharge planning. The patient is planning to be discharged home with home health services

## 2016-11-04 NOTE — Anesthesia Preprocedure Evaluation (Addendum)
Anesthesia Evaluation  Patient identified by MRN, date of birth, ID band Patient awake    Reviewed: Allergy & Precautions, H&P , NPO status , Patient's Chart, lab work & pertinent test results, reviewed documented beta blocker date and time   Airway Mallampati: III  TM Distance: >3 FB Neck ROM: Full    Dental no notable dental hx. (+) Teeth Intact, Dental Advisory Given   Pulmonary neg pulmonary ROS,    Pulmonary exam normal breath sounds clear to auscultation       Cardiovascular hypertension, Pt. on medications and Pt. on home beta blockers  Rhythm:Regular Rate:Normal     Neuro/Psych Depression negative neurological ROS     GI/Hepatic negative GI ROS, Neg liver ROS,   Endo/Other  negative endocrine ROS  Renal/GU negative Renal ROS  negative genitourinary   Musculoskeletal  (+) Arthritis , Osteoarthritis,    Abdominal   Peds  Hematology negative hematology ROS (+)   Anesthesia Other Findings   Reproductive/Obstetrics negative OB ROS                            Anesthesia Physical Anesthesia Plan  ASA: II  Anesthesia Plan: Spinal   Post-op Pain Management:  Regional for Post-op pain   Induction: Intravenous  Airway Management Planned: Simple Face Mask  Additional Equipment:   Intra-op Plan:   Post-operative Plan:   Informed Consent: I have reviewed the patients History and Physical, chart, labs and discussed the procedure including the risks, benefits and alternatives for the proposed anesthesia with the patient or authorized representative who has indicated his/her understanding and acceptance.   Dental advisory given  Plan Discussed with: CRNA  Anesthesia Plan Comments:         Anesthesia Quick Evaluation

## 2016-11-04 NOTE — Anesthesia Procedure Notes (Signed)
Spinal  Patient location during procedure: OR Start time: 11/04/2016 12:55 PM End time: 11/04/2016 12:59 PM Staffing Anesthesiologist: Gaynelle Adu Performed: anesthesiologist  Preanesthetic Checklist Completed: patient identified, surgical consent, pre-op evaluation, timeout performed, IV checked, risks and benefits discussed and monitors and equipment checked Spinal Block Patient position: sitting Prep: DuraPrep Patient monitoring: cardiac monitor, continuous pulse ox and blood pressure Approach: midline Location: L3-4 Injection technique: single-shot Needle Needle type: Pencan  Needle gauge: 24 G Needle length: 9 cm Assessment Sensory level: T8 Additional Notes Functioning IV was confirmed and monitors were applied. Sterile prep and drape, including hand hygiene and sterile gloves were used. The patient was positioned and the spine was prepped. The skin was anesthetized with lidocaine.  Free flow of clear CSF was obtained prior to injecting local anesthetic into the CSF.  The spinal needle aspirated freely following injection.  The needle was carefully withdrawn.  The patient tolerated the procedure well.

## 2016-11-04 NOTE — Brief Op Note (Signed)
11/04/2016  3:25 PM  PATIENT:  Joshua Reyes  66 y.o. male  PRE-OPERATIVE DIAGNOSIS:  LEFT KNEE OSTEOARTHRITIS  POST-OPERATIVE DIAGNOSIS:  LEFT KNEE OSTEOARTHRITIS  PROCEDURE:  Procedure(s): LEFT TOTAL KNEE ARTHROPLASTY (Left)  SURGEON:  Surgeon(s) and Role:    * Eldred Manges, MD - Primary  PHYSICIAN ASSISTANT: Zonia Kief    ANESTHESIA:   general  EBL:  Total I/O In: 1600 [I.V.:1600] Out: 1900 [Urine:1900]  BLOOD ADMINISTERED:none  DRAINS: none   LOCAL MEDICATIONS USED:  MARCAINE/exparel   SPECIMEN:  No Specimen  DISPOSITION OF SPECIMEN:  N/A  COUNTS:  YES  TOURNIQUET:   Total Tourniquet Time Documented: Thigh (Left) - 48 minutes Total: Thigh (Left) - 48 minutes   DICTATION: .Reubin Milan Dictation  PLAN OF CARE: Admit to inpatient   PATIENT DISPOSITION:  PACU - hemodynamically stable.

## 2016-11-04 NOTE — Transfer of Care (Signed)
Immediate Anesthesia Transfer of Care Note  Patient: Joshua Reyes  Procedure(s) Performed: Procedure(s): LEFT TOTAL KNEE ARTHROPLASTY (Left)  Patient Location: PACU  Anesthesia Type:Spinal  Level of Consciousness: awake, alert  and oriented  Airway & Oxygen Therapy: Patient Spontanous Breathing and Patient connected to nasal cannula oxygen  Post-op Assessment: Report given to RN and Post -op Vital signs reviewed and stable  Post vital signs: Reviewed and stable  Last Vitals:  Vitals:   11/04/16 1145 11/04/16 1150  BP: (!) 141/71 (!) 155/93  Pulse: (!) 56 (!) 58  Resp: 10 11  Temp:      Last Pain:  Vitals:   11/04/16 1047  TempSrc: Oral         Complications: No apparent anesthesia complications

## 2016-11-04 NOTE — Op Note (Signed)
Preop diagnosis: Left knee primary osteoarthritis  Postop diagnosis: Same  Procedure left cemented total knee arthroplasty.  Surgeon: Annell Greening M.D.  Asst.: Zonia Kief PA-C medically necessary and present for the entire procedure  Anesthesia spinal plus expiratory oral and Marcaine local  Tourniquet time 47 minutes  Drains: None  Implants: Depuy #6 femur with lugs, #6 tibia, 5 mm rotating platform. 38 mm 3 PEG patella.  Procedure standard prepping draping proximal by tourniquet DuraPrep the tip the toes the usual externally sheets drapes antibiotics timeout procedure sterile skin marker and Betadine Steri-Drape were applied. Leg was wrapped an Esmarch tourniquet inflated midline incision was made medial parapatellar incision was made splitting the quad tendon between the medial one third lateral two thirds. Patella was everted 10 mm were resected. Meniscal remnants were resected there was marginal osteophytes tricompartmental degenerative changes bone-on-bone changes consistent with primary osteoarthritis. 9 mm resected on the distal femur not on the tibia. Anterior cruciate ligament and PCL remnants were resected and block showed full extension with 5 mm block. Chamfer cuts made on the femur box cut on the femur he'll preparation the tibia pulse lavage removal of some posterior bone pieces three-quarter curved osteotome was used to remove spurs off posterior aspect of the femur. Trials were inserted there was good fit full extension good collateral balance flexion-extension. Pulse lavage FACS mixing of the cement. Tibia cemented followed by femur placement of the Polly then the the patella. Cement was hard at 15 minutes all excessive cement been removed tourniquet was deflated hemostasis obtained and then standard layer closure #1 Vicryl the deep layer 2 on the subtendinous tissue septic her closure postop dressing and transferred recovery room.

## 2016-11-05 ENCOUNTER — Encounter (HOSPITAL_COMMUNITY): Payer: Self-pay | Admitting: Orthopaedic Surgery

## 2016-11-05 LAB — CBC
HEMATOCRIT: 33.6 % — AB (ref 39.0–52.0)
HEMOGLOBIN: 11.5 g/dL — AB (ref 13.0–17.0)
MCH: 30.7 pg (ref 26.0–34.0)
MCHC: 34.2 g/dL (ref 30.0–36.0)
MCV: 89.6 fL (ref 78.0–100.0)
Platelets: 170 10*3/uL (ref 150–400)
RBC: 3.75 MIL/uL — AB (ref 4.22–5.81)
RDW: 12.4 % (ref 11.5–15.5)
WBC: 9.2 10*3/uL (ref 4.0–10.5)

## 2016-11-05 LAB — BASIC METABOLIC PANEL
ANION GAP: 6 (ref 5–15)
BUN: 15 mg/dL (ref 6–20)
CO2: 25 mmol/L (ref 22–32)
Calcium: 8.8 mg/dL — ABNORMAL LOW (ref 8.9–10.3)
Chloride: 105 mmol/L (ref 101–111)
Creatinine, Ser: 0.94 mg/dL (ref 0.61–1.24)
GFR calc non Af Amer: >60 mL/min (ref >60)
Glucose, Bld: 193 mg/dL — ABNORMAL HIGH (ref 65–99)
POTASSIUM: 4.7 mmol/L (ref 3.5–5.1)
SODIUM: 136 mmol/L (ref 135–145)

## 2016-11-05 NOTE — Progress Notes (Signed)
Occupational Therapy Evaluation Patient Details Name: Joshua Reyes MRN: [id number] DOB: 05-Aug-1950 Today's Date: 11/05/2016    History of Present Illness Pt is a 66 yo male admitted for L TKA on 11/04/16. PMH significant for OA, depression HLD, HTN, knee arthroscopy.    Clinical Impression   PTA, pt independent with ADL and mobility and worked part time at [employer].  Pt currently requiries min A with mobility and LB ADL. Feel pt will benefit from rehab at SNF to return to PLOF with mobility, ADL and IADL tasks. All further OT to be addressed at SNF.     Follow Up Recommendations  SNF    Equipment Recommendations  3 in 1 bedside commode    Recommendations for Other Services       Precautions / Restrictions Precautions Precautions: Knee Required Braces or Orthoses: Knee Immobilizer - Left Restrictions Weight Bearing Restrictions: Yes LLE Weight Bearing: Weight bearing as tolerated      Mobility Bed Mobility Overal bed mobility: Needs Assistance Bed Mobility: Supine to Sit     Supine to sit: Min guard     General bed mobility comments: Min guard with use of railings to get EOB  Transfers Overall transfer level: Needs assistance Equipment used: Rolling walker (2 wheeled) Transfers: Sit to/from Stand Sit to Stand: Min guard         General transfer comment: Min guard with cues for hand placement    Balance Overall balance assessment: Needs assistance Sitting-balance support: No upper extremity supported;Feet supported Sitting balance-Leahy Scale: Good     Standing balance support: Bilateral upper extremity supported Standing balance-Leahy Scale: Poor Standing balance comment: relies on Rw for stability in standing                           ADL either performed or assessed with clinical judgement   ADL Overall ADL's : Needs assistance/impaired         Upper Body Bathing: Set up;Sitting   Lower Body Bathing: Minimal assistance;Sit to/from  stand   Upper Body Dressing : Set up   Lower Body Dressing: Minimal assistance   Toilet Transfer: Minimal assistance   Toileting- Clothing Manipulation and Hygiene: Supervision/safety   Tub/ Shower Transfer: Minimal assistance   Functional mobility during ADLs: Rolling walker;Minimal assistance General ADL Comments: Difficulty reaching feet and completing LB ADL. Expressed concerns about being able to fix meals/laundry,etc and does not want to burden his girlfriend who has a handicapped child. Pt expressing concerns about not being able to get into his bathroom with his RW.     Vision         Perception     Praxis      Pertinent Vitals/Pain Pain Assessment: 0-10 Pain Score: 7  Pain Location: left knee Pain Descriptors / Indicators: Sore;Guarding;Grimacing;Aching Pain Intervention(s): Limited activity within patient's tolerance;Repositioned;Ice applied;Premedicated before session     Hand Dominance Right   Extremity/Trunk Assessment Upper Extremity Assessment Upper Extremity Assessment: Overall WFL for tasks assessed   Lower Extremity Assessment Lower Extremity Assessment: Defer to PT evaluation LLE Deficits / Details: Pt with normal post op pain and weakness. At least 3/5 ankle and 2/5 knee and hip per gross functional assessment.    Cervical / Trunk Assessment Cervical / Trunk Assessment: Normal   Communication Communication Communication: No difficulties   Cognition Arousal/Alertness: Awake/alert Behavior During Therapy: WFL for tasks assessed/performed Overall Cognitive Status: Within Functional Limits for tasks assessed  General Comments       Exercises     Shoulder Instructions      Home Living Family/patient expects to be discharged to:: Private residence Living Arrangements: Alone Available Help at Discharge: Friend(s);Available PRN/intermittently Type of Home: Mobile home Home Access: Stairs to  enter Entrance Stairs-Number of Steps: 3 Entrance Stairs-Rails: None Home Layout: One level     Bathroom Shower/Tub: IT trainer: Standard Bathroom Accessibility: No   Home Equipment: Crutches   Additional Comments: states steps are "worn out"      Prior Functioning/Environment Level of Independence: Independent        Comments: completely independent and working part time at [employer]        OT Problem List: Decreased strength;Decreased range of motion;Decreased activity tolerance;Impaired balance (sitting and/or standing);Decreased safety awareness;Decreased knowledge of use of DME or AE;Obesity;Pain      OT Treatment/Interventions:      OT Goals(Current goals can be found in the care plan section) Acute Rehab OT Goals Patient Stated Goal: to go to rehab and then home OT Goal Formulation: All assessment and education complete, DC therapy (further OT to be addressed at [facility])  OT Frequency:     Barriers to D/C:            Co-evaluation              End of Session Equipment Utilized During Treatment: Gait belt;Rolling walker CPM Left Knee CPM Left Knee: On Left Knee Flexion (Degrees): 80 Left Knee Extension (Degrees): 0 Additional Comments: applied cpm to pt left knee leg and pt tolerated well.  left knee  Activity Tolerance: Patient tolerated treatment well Patient left: in bed;with call bell/phone within reach  OT Visit Diagnosis: Unsteadiness on feet (R26.81);Other abnormalities of gait and mobility (R26.89);Pain Pain - Right/Left: Left Pain - part of body: Knee                Time: 1610-9604 OT Time Calculation (min): 22 min Charges:  OT General Charges $OT Visit: 1 Procedure OT Evaluation $OT Eval Low Complexity: 1 Procedure G-Codes:     Tyrion Glaude, OT/L  [phone] 11/05/2016  Terina Mcelhinny,HILLARY 11/05/2016, 3:56 PM

## 2016-11-05 NOTE — Progress Notes (Addendum)
Physical Therapy Treatment Patient Details Name: Joshua Reyes MRN: [id number] DOB: Jul 10, 1951 Today's Date: 11/05/2016    History of Present Illness Pt is a 66 yo male admitted for L TKA on 11/04/16. PMH significant for OA, depression HLD, HTN, knee arthroscopy.     PT Comments    Pt performed increased activity tolerance but also has poor ability to extend LLE during treatment.  Re-educated patient on use of brace to promote knee extension.     Follow Up Recommendations  SNF;Supervision/Assistance - 24 hour     Equipment Recommendations  Rolling walker with 5" wheels;3in1 (PT);Other (comment) (defer to next venue?)    Recommendations for Other Services       Precautions / Restrictions Precautions Precautions: Knee Precaution Booklet Issued: Yes (comment) Required Braces or Orthoses: Knee Immobilizer - Left Knee Immobilizer - Left: On except when in CPM Restrictions Weight Bearing Restrictions: Yes LLE Weight Bearing: Weight bearing as tolerated    Mobility  Bed Mobility Overal bed mobility: Needs Assistance Bed Mobility: Supine to Sit     Supine to sit: Min guard     General bed mobility comments: Min guard with use of railings to get EOB  Transfers Overall transfer level: Needs assistance Equipment used: Rolling walker (2 wheeled) Transfers: Sit to/from Stand Sit to Stand: Min assist         General transfer comment: Min guard with cues for hand placement  Ambulation/Gait Ambulation/Gait assistance: Min guard Ambulation Distance (Feet): 34 Feet (+40 ft) Assistive device: Rolling walker (2 wheeled) Gait Pattern/deviations: Step-to pattern;Step-through pattern;Decreased step length - right;Decreased stance time - left;Antalgic Gait velocity: decreased   General Gait Details: Moderate antalgic gait with cues for sequencing, poor heel strike and innability to extend L knee in stance phase.     Stairs            Wheelchair Mobility    Modified  Rankin (Stroke Patients Only)       Balance Overall balance assessment: Needs assistance Sitting-balance support: No upper extremity supported;Feet supported Sitting balance-Leahy Scale: Good     Standing balance support: Bilateral upper extremity supported Standing balance-Leahy Scale: Poor Standing balance comment: relies on Rw for stability in standing                            Cognition Arousal/Alertness: Awake/alert Behavior During Therapy: WFL for tasks assessed/performed Overall Cognitive Status: Within Functional Limits for tasks assessed                                        Exercises Total Joint Exercises Quad Sets: AROM;Left;10 reps;Supine Long Arc Quad: AAROM;Left;10 reps;Supine Other Exercises Other Exercises: Incentive Spirometer 1x10 reps .  Cues for correct technique and frequencing.      General Comments        Pertinent Vitals/Pain Pain Assessment: 0-10 Pain Score: 7  Pain Location: left knee Pain Descriptors / Indicators: Sore;Guarding;Grimacing;Aching Pain Intervention(s): Monitored during session;Repositioned    Home Living Family/patient expects to be discharged to:: Private residence Living Arrangements: Alone Available Help at Discharge: Friend(s);Available PRN/intermittently Type of Home: Mobile home Home Access: Stairs to enter Entrance Stairs-Rails: None Home Layout: One level Home Equipment: Crutches Additional Comments: states steps are "worn out"    Prior Function Level of Independence: Independent      Comments: completely independent and working part time  at [employer]   PT Goals (current goals can now be found in the care plan section) Acute Rehab PT Goals Patient Stated Goal: to go to rehab and then home Potential to Achieve Goals: Good    Frequency    7X/week      PT Plan Current plan remains appropriate    Co-evaluation             End of Session Equipment Utilized During  Treatment: Gait belt Activity Tolerance: Patient tolerated treatment well Patient left: in chair;with call bell/phone within reach Nurse Communication: Mobility status;Patient requests pain meds PT Visit Diagnosis: Difficulty in walking, not elsewhere classified (R26.2);Pain Pain - Right/Left: Left Pain - part of body: Knee     Time: 4098-1191 PT Time Calculation (min) (ACUTE ONLY): 34 min  Charges:  $Gait Training: 8-22 mins $Therapeutic Exercise: 8-22 mins                    G Codes:       Joycelyn Rua, PTA pager [phone]    Florestine Avers 11/05/2016, 6:05 PM

## 2016-11-05 NOTE — Progress Notes (Signed)
   Subjective: 1 Day Post-Op Procedure(s) (LRB): LEFT TOTAL KNEE ARTHROPLASTY (Left) Patient reports pain as moderate.    Objective: Vital signs in last 24 hours: Temp:  [97.7 F (36.5 C)-98.3 F (36.8 C)] 98.1 F (36.7 C) (04/12 0518) Pulse Rate:  [49-72] 72 (04/12 0518) Resp:  [8-20] 16 (04/12 0518) BP: (117-217)/(57-100) 126/71 (04/12 0518) SpO2:  [94 %-100 %] 97 % (04/12 0518) Weight:  [213 lb (96.6 kg)] 213 lb (96.6 kg) (04/11 1052)  Intake/Output from previous day: 04/11 0701 - 04/12 0700 In: 3420 [P.O.:800; I.V.:2620] Out: 3700 [Urine:3650; Blood:50] Intake/Output this shift: No intake/output data recorded.   Recent Labs  11/05/16 0314  HGB 11.5*    Recent Labs  11/05/16 0314  WBC 9.2  RBC 3.75*  HCT 33.6*  PLT 170    Recent Labs  11/05/16 0314  NA 136  K 4.7  CL 105  CO2 25  BUN 15  CREATININE 0.94  GLUCOSE 193*  CALCIUM 8.8*   No results for input(s): LABPT, INR in the last 72 hours.  Neurologically intact    On CPM   0-80 right now.  Dg Knee 1-2 Views Left  Result Date: 11/04/2016 CLINICAL DATA:  Status post left total knee joint prosthesis placement. EXAM: LEFT KNEE - 1-2 VIEW COMPARISON:  Preoperative study of October 30, 2014 FINDINGS: The patient has undergone total left knee joint prosthesis placement. Radiographic positioning of the prosthetic components is good. The interface with the native bone is normal. No acute native bone abnormality is observed. There is a small amount of gas within the anterior soft tissues. IMPRESSION: There is no immediate postprocedure complication following left total knee joint prosthesis placement. Electronically Signed   By: David  [place] M.D.   On: 11/04/2016 16:40    Assessment/Plan: 1 Day Post-Op Procedure(s) (LRB): LEFT TOTAL KNEE ARTHROPLASTY (Left) Up with therapy  patiient wants to go to SNF  Eldred Manges 11/05/2016, 7:32 AM

## 2016-11-05 NOTE — Evaluation (Signed)
Physical Therapy Evaluation Patient Details Name: Joshua Reyes MRN: [id number] DOB: [birth year] Today's Date: 11/05/2016   History of Present Illness  Pt is a 66 yo male admitted for L TKA on 11/04/16. PMH significant for OA, depression HLD, HTN, knee arthroscopy.   Clinical Impression  Pt is POD 1 and limited with mobility this session. Pt is able to perform bed mobs, gait and transfers with Min guard A, but does become fatigued following session. Prior to admission, pt was working part time at [employer]. Pt has a girlfriend who is able to assist PRN, but has two jobs and a child of her own at home. Pt will be returning home alone without assistance and has 3 steps to get into his home without railings. Pt will benefit from SNF placement at discharge in order to maximize his functional outcomes before returning home. Pt will require continued acute therapy services to progress prior to venue recommended below.     Follow Up Recommendations SNF;Supervision/Assistance - 24 hour    Equipment Recommendations  Rolling walker with 5" wheels;3in1 (PT);Other (comment) (defer to next venue?)    Recommendations for Other Services       Precautions / Restrictions Precautions Precautions: Knee Precaution Booklet Issued: Yes (comment) Precaution Comments: handout given and reviewed Required Braces or Orthoses: Knee Immobilizer - Left Knee Immobilizer - Left: On except when in CPM Restrictions Weight Bearing Restrictions: Yes LLE Weight Bearing: Weight bearing as tolerated      Mobility  Bed Mobility Overal bed mobility: Needs Assistance Bed Mobility: Supine to Sit     Supine to sit: Min guard     General bed mobility comments: Min guard with use of railings to get EOB  Transfers Overall transfer level: Needs assistance Equipment used: Rolling walker (2 wheeled) Transfers: Sit to/from Stand Sit to Stand: Min guard         General transfer comment: Min guard with cues for  hand placement  Ambulation/Gait Ambulation/Gait assistance: Min guard Ambulation Distance (Feet): 30 Feet Assistive device: Rolling walker (2 wheeled) Gait Pattern/deviations: Step-to pattern;Step-through pattern;Decreased step length - right;Decreased stance time - left;Antalgic Gait velocity: decreased Gait velocity interpretation: Below normal speed for age/gender General Gait Details: Moderate antalgic gait with cues for sequencing, KI in place  Stairs            Wheelchair Mobility    Modified Rankin (Stroke Patients Only)       Balance Overall balance assessment: Needs assistance Sitting-balance support: No upper extremity supported;Feet supported Sitting balance-Leahy Scale: Good     Standing balance support: Bilateral upper extremity supported Standing balance-Leahy Scale: Poor Standing balance comment: relies on Rw for stability in standing                             Pertinent Vitals/Pain Pain Assessment: 0-10 Pain Score: 8  Pain Location: left knee Pain Descriptors / Indicators: Sore;Guarding;Grimacing Pain Intervention(s): Monitored during session;Premedicated before session;Ice applied    Home Living Family/patient expects to be discharged to:: Private residence Living Arrangements: Alone Available Help at Discharge: Friend(s);Available PRN/intermittently Type of Home: Mobile home Home Access: Stairs to enter Entrance Stairs-Rails: None Entrance Stairs-Number of Steps: 3 Home Layout: One level Home Equipment: Crutches      Prior Function Level of Independence: Independent         Comments: completely independent and working part time at [employer]  Dominant Hand: Right    Extremity/Trunk Assessment   Upper Extremity Assessment Upper Extremity Assessment: Defer to OT evaluation    Lower Extremity Assessment Lower Extremity Assessment: LLE deficits/detail LLE Deficits / Details: Pt with normal post  op pain and weakness. At least 3/5 ankle and 2/5 knee and hip per gross functional assessment.     Cervical / Trunk Assessment Cervical / Trunk Assessment: Normal  Communication   Communication: No difficulties  Cognition Arousal/Alertness: Awake/alert Behavior During Therapy: WFL for tasks assessed/performed Overall Cognitive Status: Within Functional Limits for tasks assessed                                        General Comments      Exercises Total Joint Exercises Ankle Circles/Pumps: AROM;Left;10 reps;Supine Quad Sets: AROM;Left;10 reps;Supine Heel Slides: AAROM;Left;10 reps;Supine Goniometric ROM: 20-75   Assessment/Plan    PT Assessment Patient needs continued PT services  PT Problem List Decreased strength;Decreased range of motion;Decreased activity tolerance;Decreased balance;Decreased mobility;Decreased knowledge of use of DME;Pain       PT Treatment Interventions DME instruction;Gait training;Stair training;Functional mobility training;Therapeutic activities;Therapeutic exercise;Balance training;Patient/family education    PT Goals (Current goals can be found in the Care Plan section)  Acute Rehab PT Goals Patient Stated Goal: to go to rehab and then home PT Goal Formulation: With patient Time For Goal Achievement: 11/12/16 Potential to Achieve Goals: Good    Frequency 7X/week   Barriers to discharge Decreased caregiver support will not have but PRN assistance when returning home    Co-evaluation               End of Session Equipment Utilized During Treatment: Gait belt;Left knee immobilizer Activity Tolerance: Patient tolerated treatment well Patient left: in chair;with call bell/phone within reach Nurse Communication: Mobility status;Patient requests pain meds PT Visit Diagnosis: Difficulty in walking, not elsewhere classified (R26.2);Pain Pain - Right/Left: Left Pain - part of body: Knee    Time: 1610-9604 PT Time  Calculation (min) (ACUTE ONLY): 39 min   Charges:   PT Evaluation $PT Eval Moderate Complexity: 1 Procedure PT Treatments $Gait Training: 8-22 mins $Therapeutic Exercise: 8-22 mins   PT G Codes:        Colin Broach PT, DPT  [phone]   Ruel Favors Aletha Halim 11/05/2016, 11:37 AM

## 2016-11-06 LAB — CBC
HCT: 31.8 % — ABNORMAL LOW (ref 39.0–52.0)
Hemoglobin: 10.9 g/dL — ABNORMAL LOW (ref 13.0–17.0)
MCH: 30.9 pg (ref 26.0–34.0)
MCHC: 34.3 g/dL (ref 30.0–36.0)
MCV: 90.1 fL (ref 78.0–100.0)
PLATELETS: 156 10*3/uL (ref 150–400)
RBC: 3.53 MIL/uL — AB (ref 4.22–5.81)
RDW: 12.4 % (ref 11.5–15.5)
WBC: 9.8 10*3/uL (ref 4.0–10.5)

## 2016-11-06 MED ORDER — OXYCODONE-ACETAMINOPHEN 5-325 MG PO TABS: 1.0000 | ORAL_TABLET | Freq: Four times a day (QID) | ORAL | 0 refills | Status: AC | PRN

## 2016-11-06 MED ORDER — ASPIRIN 325 MG PO TBEC: 325.0000 mg | DELAYED_RELEASE_TABLET | Freq: Every day | ORAL | 0 refills | Status: AC

## 2016-11-06 MED ORDER — METHOCARBAMOL 500 MG PO TABS: 500.0000 mg | ORAL_TABLET | Freq: Four times a day (QID) | ORAL | 0 refills | Status: AC | PRN

## 2016-11-06 NOTE — Clinical Social Work Placement (Signed)
   CLINICAL SOCIAL WORK PLACEMENT  NOTE  Date:  11/06/2016  Patient Details  Name: Joshua Reyes MRN: [id number] Date of Birth: 10/07/1950  Clinical Social Work is seeking post-discharge placement for this patient at the Skilled  Nursing Facility level of care (*CSW will initial, date and re-position this form in  chart as items are completed):  Yes   Patient/family provided with [facility] Clinical Social Work Department's list of facilities offering this level of care within the geographic area requested by the patient (or if unable, by the patient's family).  Yes   Patient/family informed of their freedom to choose among providers that offer the needed level of care, that participate in Medicare, Medicaid or managed care program needed by the patient, have an available bed and are willing to accept the patient.  Yes   Patient/family informed of [facility]'s ownership interest in [facility] and [facility], as well as of the fact that they are under no obligation to receive care at these facilities.  PASRR submitted to EDS on       PASRR number received on 11/06/16     Existing PASRR number confirmed on       FL2 transmitted to all facilities in geographic area requested by pt/family on 11/06/16     FL2 transmitted to all facilities within larger geographic area on 11/06/16     Patient informed that his/her managed care company has contracts with or will negotiate with certain facilities, including the following:        Yes   Patient/family informed of bed offers received.  Patient chooses bed at [facility]     Physician recommends and patient chooses bed at      Patient to be transferred to [facility] on 11/06/16.  Patient to be transferred to facility by [facility]     Patient family notified on 11/06/16 of transfer.  Name of family member notified:  Brother at bedside     PHYSICIAN Please sign FL2, Please prepare priority discharge  summary, including medications, Please prepare prescriptions     Additional Comment:    _______________________________________________ Tresa Moore, LCSW 11/06/2016, 2:28 PM

## 2016-11-06 NOTE — Clinical Social Work Note (Signed)
Clinical Social Worker facilitated patient discharge including contacting patient family and facility to confirm patient discharge plans. Clinical information faxed to facility and family agreeable with plan.  CSW arranged ambulance transport via [facility] to [facility].  RN to call report [phone] prior to discharge.  Clinical Social Worker will sign off for now as social work intervention is no longer needed. Please consult [place] again if new need arises.  Keene Breath, LCSW [phone]

## 2016-11-06 NOTE — Discharge Summary (Signed)
Patient ID: Joshua Reyes MRN: [id number] DOB/AGE: 30-Jan-1951 66 y.o.  Admit date: 11/04/2016 Discharge date: 11/06/2016  Admission Diagnoses:  Active Problems:   Primary osteoarthritis of left knee   Arthritis of left knee   Discharge Diagnoses:  Active Problems:   Primary osteoarthritis of left knee   Arthritis of left knee  status post Procedure(s): LEFT TOTAL KNEE ARTHROPLASTY  Past Medical History:  Diagnosis Date  . Arthritis   . Depression   . Hypercholesteremia   . Hypertension     Surgeries: Procedure(s): LEFT TOTAL KNEE ARTHROPLASTY on 11/04/2016   Consultants:   Discharged Condition: Improved  Hospital Course: Joshua Reyes is an 66 y.o. male who was admitted 11/04/2016 for operative treatment of knee djd. Patient failed conservative treatments (please see the history and physical for the specifics) and had severe unremitting pain that affects sleep, daily activities and work/hobbies. After pre-op clearance, the patient was taken to the operating room on 11/04/2016 and underwent  Procedure(s): LEFT TOTAL KNEE ARTHROPLASTY.    Patient was given perioperative antibiotics: Anti-infectives    Start     Dose/Rate Route Frequency Ordered Stop   11/04/16 1200  ceFAZolin (ANCEF) IVPB 2g/100 mL premix     2 g 200 mL/hr over 30 Minutes Intravenous To ShortStay Surgical 11/03/16 1046 11/04/16 1305   11/04/16 1045  ceFAZolin (ANCEF) IVPB 2g/100 mL premix  Status:  Discontinued     2 g 200 mL/hr over 30 Minutes Intravenous On call to O.R. 11/04/16 1043 11/04/16 1754       Patient was given sequential compression devices and early ambulation to prevent DVT.   Patient benefited maximally from hospital stay and there were no complications. At the time of discharge, the patient was urinating/moving their bowels without difficulty, tolerating a regular diet, pain is controlled with oral pain medications and they have been cleared by PT/OT.   Recent vital signs:  Patient Vitals for the past 24 hrs:  BP Temp Temp src Pulse Resp SpO2  11/06/16 0515 (!) 174/83 98.8 F (37.1 C) Oral 66 16 99 %  11/06/16 0036 (!) 169/73 - - - - -  11/05/16 2334 (!) 185/74 - - - - -  11/05/16 2127 (!) 173/75 99.6 F (37.6 C) Oral 61 18 100 %  11/05/16 1426 (!) 117/56 98 F (36.7 C) Axillary 60 16 99 %     Recent laboratory studies:  Recent Labs  11/05/16 0314 11/06/16 0441  WBC 9.2 9.8  HGB 11.5* 10.9*  HCT 33.6* 31.8*  PLT 170 156  NA 136  --   K 4.7  --   CL 105  --   CO2 25  --   BUN 15  --   CREATININE 0.94  --   GLUCOSE 193*  --   CALCIUM 8.8*  --      Discharge Medications:   Allergies as of 11/06/2016      Reactions   No Known Allergies       Medication List    STOP taking these medications   ibuprofen 200 MG tablet Commonly known as:  ADVIL,MOTRIN   TURMERIC PO     TAKE these medications   aspirin 325 MG EC tablet Take 1 tablet (325 mg total) by mouth daily with breakfast.   atenolol 25 MG tablet Commonly known as:  TENORMIN Take 25 mg by mouth daily.   FLUoxetine 20 MG capsule Commonly known as:  PROZAC Take 20 mg by mouth daily.  methocarbamol 500 MG tablet Commonly known as:  ROBAXIN Take 1 tablet (500 mg total) by mouth every 6 (six) hours as needed for muscle spasms.   oxyCODONE-acetaminophen 5-325 MG tablet Commonly known as:  ROXICET Take 1-2 tablets by mouth every 6 (six) hours as needed for severe pain.   simvastatin 20 MG tablet Commonly known as:  ZOCOR Take 20 mg by mouth daily.       Diagnostic Studies: Dg Chest 2 View  Result Date: 10/29/2016 CLINICAL DATA:  Preoperative exam prior to left total knee arthroplasty. History of hypertension and hyperlipidemia. Nonsmoker. EXAM: CHEST  2 VIEW COMPARISON:  None in PACs FINDINGS: The lungs are adequately inflated and clear. The heart and pulmonary vascularity are normal. The mediastinum is normal in width. There is no pleural effusion. There is calcification  in the wall of the aortic arch. The bony thorax is unremarkable. IMPRESSION: There is no acute cardiopulmonary abnormality. Thoracic aortic atherosclerosis. Electronically Signed   By: David  [place] M.D.   On: 10/29/2016 13:22   Dg Knee 1-2 Views Left  Result Date: 11/04/2016 CLINICAL DATA:  Status post left total knee joint prosthesis placement. EXAM: LEFT KNEE - 1-2 VIEW COMPARISON:  Preoperative study of October 30, 2014 FINDINGS: The patient has undergone total left knee joint prosthesis placement. Radiographic positioning of the prosthetic components is good. The interface with the native bone is normal. No acute native bone abnormality is observed. There is a small amount of gas within the anterior soft tissues. IMPRESSION: There is no immediate postprocedure complication following left total knee joint prosthesis placement. Electronically Signed   By: David  [place] M.D.   On: 11/04/2016 16:40      Follow-up Information    Eldred Manges, MD. Schedule an appointment as soon as possible for a visit today.   Specialty:  Orthopedic Surgery Why:  need return office visit 2 weeks postop Contact information: [address] [place] [address] [phone]           Discharge Plan:  discharge to snf  Disposition:     Signed: Zonia Kief for Annell Greening MD 11/06/2016, 10:15 AM

## 2016-11-06 NOTE — Progress Notes (Signed)
Pt was discharged this evening. Ambulance transport via [facility] to [facility]. Pt in no distress. Vitals taken and pain medicine given prior to discharge.

## 2016-11-06 NOTE — Progress Notes (Signed)
Report called to [facility]. All personal belongings with pt. PT demonstrastes no s/sx of distress. AVS and rx with transport to facility

## 2016-11-06 NOTE — Progress Notes (Signed)
Physical Therapy Treatment Note:  Clinical Impression:Pt continues to be moving slowly with therapy but is able to progress gait distance this session. Increased c/o knee discomfort continues to limit his ability to perform gait without KI in place. Applied CPM to comfort and advised pt on how to turn it off when he is ready.    11/06/16 1317  PT Visit Information  Last PT Received On 11/06/16  Assistance Needed +1  History of Present Illness Pt is a 66 yo male admitted for L TKA on 11/04/16. PMH significant for OA, depression HLD, HTN, knee arthroscopy.   Subjective Data  Subjective pt states tht he continues to feel weaker and doesn't have much of an appetite.   Patient Stated Goal to go to rehab and then home  Precautions  Precautions Knee  Precaution Booklet Issued Yes (comment)  Precaution Comments handout given and reviewed  Required Braces or Orthoses Knee Immobilizer - Left  Knee Immobilizer - Left On except when in CPM  Restrictions  Weight Bearing Restrictions Yes  LLE Weight Bearing WBAT  Pain Assessment  Pain Assessment Faces  Faces Pain Scale 4  Pain Location left knee  Pain Descriptors / Indicators Sore;Guarding;Grimacing;Aching  Pain Intervention(s) Premedicated before session;Monitored during session;Repositioned;Ice applied  Cognition  Arousal/Alertness Awake/alert  Behavior During Therapy WFL for tasks assessed/performed  Overall Cognitive Status Within Functional Limits for tasks assessed  Bed Mobility  Overal bed mobility Needs Assistance  Bed Mobility Supine to Sit  Supine to sit Min assist  General bed mobility comments Min A this session to bring LLE EOB  Transfers  Overall transfer level Needs assistance  Equipment used Rolling walker (2 wheeled)  Transfers Sit to/from Stand  Sit to Stand Min guard  General transfer comment Min guard with cues for hand placement  Ambulation/Gait  Ambulation/Gait assistance Min guard  Ambulation Distance (Feet) 50  Feet (+50)  Assistive device Rolling walker (2 wheeled)  Gait Pattern/deviations Step-to pattern;Step-through pattern;Decreased step length - right;Decreased stance time - left;Antalgic  General Gait Details Moderate antalgic gait with cues for sequencing, poor heel strike and innability to extend L knee in stance phase.    Gait velocity decreased  Gait velocity interpretation Below normal speed for age/gender  Total Joint Exercises  Goniometric ROM 10-75  PT - End of Session  Equipment Utilized During Treatment Gait belt  Activity Tolerance Patient tolerated treatment well  Patient left in bed;in CPM;with call bell/phone within reach  Nurse Communication Mobility status;Patient requests pain meds  CPM Left Knee  CPM Left Knee On  Left Knee Flexion (Degrees) 70  Left Knee Extension (Degrees) 0  Additional Comments applied CPM to L knee. Left at 70 degrees.   PT - Assessment/Plan  PT Plan Current plan remains appropriate  PT Visit Diagnosis Difficulty in walking, not elsewhere classified (R26.2);Pain  Pain - Right/Left Left  Pain - part of body Knee  PT Frequency (ACUTE ONLY) 7X/week  Follow Up Recommendations SNF;Supervision/Assistance - 24 hour  PT equipment Rolling walker with 5" wheels;3in1 (PT);Other (comment)  AM-PAC PT "6 Clicks" Daily Activity Outcome Measure  Difficulty turning over in bed (including adjusting bedclothes, sheets and blankets)? 3  Difficulty moving from lying on back to sitting on the side of the bed?  3  Difficulty sitting down on and standing up from a chair with arms (e.g., wheelchair, bedside commode, etc,.)? 3  Help needed moving to and from a bed to chair (including a wheelchair)? 3  Help needed walking in hospital  room? 3  Help needed climbing 3-5 steps with a railing?  1  6 Click Score 16  Mobility G Code  CK  PT Time Calculation  PT Start Time (ACUTE ONLY) 1240  PT Stop Time (ACUTE ONLY) 1315  PT Time Calculation (min) (ACUTE ONLY) 35 min  PT  General Charges  $$ ACUTE PT VISIT 1 Procedure  PT Treatments  $Gait Training 8-22 mins  $Therapeutic Activity 8-22 mins   Joshua Reyes PT, DPT  [phone]

## 2016-11-06 NOTE — Progress Notes (Signed)
Orthopedic Tech Progress Note Patient Details:  Joshua Reyes Sep 27, 1950 [id number]  Ortho Devices Type of Ortho Device: Other (comment) Ortho Device/Splint Location: bone foam Ortho Device/Splint Interventions: Ordered, Application   Trinna Post 11/06/2016, 10:42 AM

## 2016-11-06 NOTE — Discharge Instructions (Signed)

## 2016-11-06 NOTE — Progress Notes (Signed)
Physical Therapy Treatment Patient Details Name: Joshua Reyes MRN: [id number] DOB: 1950-09-16 Today's Date: 11/06/2016    History of Present Illness Pt is a 66 yo male admitted for L TKA on 11/04/16. PMH significant for OA, depression HLD, HTN, knee arthroscopy.     PT Comments    Pt is POD 2 and is moving slowly with therapy this session. Pt continues to have increased pain in left knee with mobility and difficulty achieving extension. Instructed on exercises and positioning to improve extension. Pt continues to be a good candidate for SNF placement at this time.     Follow Up Recommendations  SNF;Supervision/Assistance - 24 hour     Equipment Recommendations  Rolling walker with 5" wheels;3in1 (PT);Other (comment)    Recommendations for Other Services       Precautions / Restrictions Precautions Precautions: Knee Precaution Booklet Issued: Yes (comment) Precaution Comments: handout given and reviewed Required Braces or Orthoses: Knee Immobilizer - Left Knee Immobilizer - Left: On except when in CPM Restrictions Weight Bearing Restrictions: Yes LLE Weight Bearing: Weight bearing as tolerated    Mobility  Bed Mobility Overal bed mobility: Needs Assistance Bed Mobility: Supine to Sit     Supine to sit: Min assist     General bed mobility comments: Min A this session to bring LLE EOB  Transfers Overall transfer level: Needs assistance Equipment used: Rolling walker (2 wheeled) Transfers: Sit to/from Stand Sit to Stand: Min guard         General transfer comment: Min guard with cues for hand placement  Ambulation/Gait Ambulation/Gait assistance: Min guard Ambulation Distance (Feet): 35 Feet (50) Assistive device: Rolling walker (2 wheeled) Gait Pattern/deviations: Step-to pattern;Step-through pattern;Decreased step length - right;Decreased stance time - left;Antalgic Gait velocity: decreased Gait velocity interpretation: Below normal speed for  age/gender General Gait Details: Moderate antalgic gait with cues for sequencing, poor heel strike and innability to extend L knee in stance phase.     Stairs            Wheelchair Mobility    Modified Rankin (Stroke Patients Only)       Balance                                            Cognition Arousal/Alertness: Awake/alert Behavior During Therapy: WFL for tasks assessed/performed Overall Cognitive Status: Within Functional Limits for tasks assessed                                        Exercises Total Joint Exercises Hip ABduction/ADduction: AAROM;Left;10 reps;Supine Straight Leg Raises: AAROM;Left;10 reps;Supine    General Comments        Pertinent Vitals/Pain Pain Assessment: 0-10 Pain Score: 4  Pain Location: left knee Pain Descriptors / Indicators: Sore;Guarding;Grimacing;Aching Pain Intervention(s): Monitored during session;Premedicated before session;Ice applied    Home Living                      Prior Function            PT Goals (current goals can now be found in the care plan section) Acute Rehab PT Goals Patient Stated Goal: to go to rehab and then home Progress towards PT goals: Progressing toward goals    Frequency    7X/week  PT Plan Current plan remains appropriate    Co-evaluation             End of Session Equipment Utilized During Treatment: Gait belt Activity Tolerance: Patient tolerated treatment well Patient left: in chair;with call bell/phone within reach Nurse Communication: Mobility status;Patient requests pain meds PT Visit Diagnosis: Difficulty in walking, not elsewhere classified (R26.2);Pain Pain - Right/Left: Left Pain - part of body: Knee     Time: 0913-0940 PT Time Calculation (min) (ACUTE ONLY): 27 min  Charges:  $Gait Training: 8-22 mins $Therapeutic Exercise: 8-22 mins                    G Codes:       Colin Broach PT, DPT   [phone]    Ruel Favors Aletha Halim 11/06/2016, 9:48 AM

## 2016-11-06 NOTE — NC FL2 (Signed)
MEDICAID FL2 LEVEL OF CARE SCREENING TOOL     IDENTIFICATION  Patient Name: Joshua Reyes Birthdate: 02/02/1951 Sex: male Admission Date (Current Location): 11/04/2016  [facility] and [place] Number:  Producer, television/film/video and Address:  [facility] [place]. [facility], [address], [place], [place] [address]      Provider Number: [id number]  Attending Physician Name and Address:  Eldred Manges, MD  Relative Name and Phone Number:       Current Level of Care: Hospital Recommended Level of Care: Skilled Nursing Facility Prior Approval Number:    Date Approved/Denied:   PASRR Number: [id number]  Discharge Plan: SNF    Current Diagnoses: Patient Active Problem List   Diagnosis Date Noted  . Arthritis of left knee 11/04/2016  . Primary osteoarthritis of left knee     Orientation RESPIRATION BLADDER Height & Weight     Self, Time, Situation, Place  Normal Continent Weight: 213 lb (96.6 kg) Height:   (175.3 cm)  BEHAVIORAL SYMPTOMS/MOOD NEUROLOGICAL BOWEL NUTRITION STATUS      Continent Diet (See DC summary)  AMBULATORY STATUS COMMUNICATION OF NEEDS Skin   Extensive Assist Verbally Surgical wounds (Closed insiscion left knee with adhesive banadage)                       Personal Care Assistance Level of Assistance  Bathing, Feeding Bathing Assistance: Limited assistance Feeding assistance: Independent Dressing Assistance: Limited assistance     Functional Limitations Info             SPECIAL CARE FACTORS FREQUENCY  PT (By licensed PT), OT (By licensed OT)     PT Frequency: 5xweek OT Frequency: 5xweek            Contractures      Additional Factors Info  Code Status, Allergies Code Status Info: FULL Allergies Info: NKA           Current Medications (11/06/2016):  This is the current hospital active medication list Current Facility-Administered Medications  Medication Dose Route Frequency Provider Last Rate Last Dose   . 0.9 %  sodium chloride infusion   Intravenous Continuous Naida Sleight, PA-C   Stopped at 11/05/16 [phone]  . acetaminophen (TYLENOL) tablet 650 mg  650 mg Oral Q6H PRN Naida Sleight, PA-C       Or  . acetaminophen (TYLENOL) suppository 650 mg  650 mg Rectal Q6H PRN Naida Sleight, PA-C      . aspirin EC tablet 325 mg  325 mg Oral Q breakfast Naida Sleight, PA-C   325 mg at 11/06/16 1018  . atenolol (TENORMIN) tablet 25 mg  25 mg Oral Daily Naida Sleight, PA-C   25 mg at 11/06/16 1018  . docusate sodium (COLACE) capsule 100 mg  100 mg Oral BID Naida Sleight, PA-C   100 mg at 11/06/16 1017  . FLUoxetine (PROZAC) capsule 20 mg  20 mg Oral Daily Naida Sleight, PA-C   20 mg at 11/06/16 1017  . HYDROmorphone (DILAUDID) injection 0.5-1 mg  0.5-1 mg Intravenous Q3H PRN Naida Sleight, PA-C      . lactated ringers infusion   Intravenous Continuous Gaynelle Adu, MD   Stopped at 11/04/16 1800  . menthol-cetylpyridinium (CEPACOL) lozenge 3 mg  1 lozenge Oral PRN Naida Sleight, PA-C       Or  . phenol (CHLORASEPTIC) mouth spray 1 spray  1 spray Mouth/Throat PRN  Naida Sleight, PA-C      . methocarbamol (ROBAXIN) tablet 500 mg  500 mg Oral Q6H PRN Naida Sleight, PA-C   500 mg at 11/05/16 0547   Or  . methocarbamol (ROBAXIN) 500 mg in dextrose 5 % 50 mL IVPB  500 mg Intravenous Q6H PRN Naida Sleight, PA-C      . metoCLOPramide (REGLAN) tablet 5-10 mg  5-10 mg Oral Q8H PRN Naida Sleight, PA-C       Or  . metoCLOPramide (REGLAN) injection 5-10 mg  5-10 mg Intravenous Q8H PRN Naida Sleight, PA-C      . ondansetron [facility]) tablet 4 mg  4 mg Oral Q6H PRN Naida Sleight, PA-C       Or  . ondansetron [facility]) injection 4 mg  4 mg Intravenous Q6H PRN Naida Sleight, PA-C      . oxyCODONE (Oxy IR/ROXICODONE) immediate release tablet 5-10 mg  5-10 mg Oral Q4H PRN Naida Sleight, PA-C   10 mg at 11/06/16 0424  . polyethylene glycol (MIRALAX / GLYCOLAX) packet 17 g  17 g Oral Daily PRN Naida Sleight, PA-C      .  simvastatin (ZOCOR) tablet 20 mg  20 mg Oral QHS Naida Sleight, PA-C   20 mg at 11/05/16 2219     Discharge Medications: Please see discharge summary for a list of discharge medications.  Relevant Imaging Results:  Relevant Lab Results:   Additional Information [id number];  Tresa Moore, LCSW

## 2016-11-06 NOTE — Clinical Social Work Note (Signed)
Clinical Social Work Assessment  Patient Details  Name: Joshua Reyes MRN: [id number] Date of Birth: 1951-04-27  Date of referral:  11/06/16               Reason for consult:  Facility Placement                Permission sought to share information with:  Facility Art therapist granted to share information::  Yes, Verbal Permission Granted  Name::        Agency::  SNF  Relationship::     Contact Information:     Housing/Transportation Living arrangements for the past 2 months:  Single Family Home Source of Information:    Patient Interpreter Needed:  None Criminal Activity/Legal Involvement Pertinent to Current Situation/Hospitalization:  No - Comment as needed Significant Relationships:  Other Family Members Lives with:  Self Do you feel safe going back to the place where you live?  No Need for family participation in patient care:  No (Coment)  Care giving concerns:  Patient resided alone and independently prior to surgery. He retired from full-time position and now works part time in Ambulance person.  At this time it is unsafe for him to return home due to his current mobility impairment.  Social Worker assessment / plan:  CSW met with patient to discuss DC plan and SNF placement.  CSW explained short term rehabilitation process as patient indicated that he has never received care in SNF.  Patient indicated that he wants to stay in the [place] and [place] area as that is close to family and friends. Patient was AOx4 and engaged appropriately during assessment.  Employment status:  Retired Nurse, adult PT Recommendations:  Central / Referral to community resources:  [place]  Patient/Family's Response to care:  Patient responded positively to SNF options and placement. He reports no issues with care at this time.  Patient/Family's Understanding of and Emotional Response to Diagnosis,  Current Treatment, and Prognosis:  Patient agreeable with the DC plan, treatment and prognosis.  He is hopeful  that he will return to baseline once short term rehabilitation is complete. Patient reports no concerns at this time.  Emotional Assessment Appearance:  Appears stated age Attitude/Demeanor/Rapport:   (Cooperative) Affect (typically observed):  Accepting, Appropriate Orientation:  Oriented to Self, Oriented to Place, Oriented to  Time, Oriented to Situation Alcohol / Substance use:  Not Applicable Psych involvement (Current and /or in the community):  No (Comment)  Discharge Needs  Concerns to be addressed:  Care Coordination Readmission within the last 30 days:  No Current discharge risk:  Physical Impairment, Dependent with Mobility Barriers to Discharge:  No Barriers Identified   Normajean Baxter, LCSW 11/06/2016, 10:40 AM

## 2016-11-19 ENCOUNTER — Ambulatory Visit (INDEPENDENT_AMBULATORY_CARE_PROVIDER_SITE_OTHER): Payer: Medicare Other | Admitting: Orthopaedic Surgery

## 2016-11-19 ENCOUNTER — Ambulatory Visit (INDEPENDENT_AMBULATORY_CARE_PROVIDER_SITE_OTHER): Payer: Medicare Other

## 2016-11-19 ENCOUNTER — Encounter (INDEPENDENT_AMBULATORY_CARE_PROVIDER_SITE_OTHER): Payer: Self-pay | Admitting: Orthopaedic Surgery

## 2016-11-19 VITALS — BP 97/72 | HR 78 | Ht 69.0 in | Wt 213.0 lb

## 2016-11-19 DIAGNOSIS — M1712 Unilateral primary osteoarthritis, left knee: Secondary | ICD-10-CM

## 2016-11-19 DIAGNOSIS — Z96652 Presence of left artificial knee joint: Secondary | ICD-10-CM

## 2016-11-19 NOTE — Progress Notes (Signed)
   Post-Op Visit Note   Patient: Joshua Reyes           Date of Birth: [birth year]           MRN: [id number] Visit Date: 11/19/2016 PCP: Ignatius Specking, MD   Assessment & Plan: Patient stating the skilled facility for rehabilitation total knee arthroplasty.  Chief Complaint:  Chief Complaint  Patient presents with  . Left Knee - Routine Post Op   Visit Diagnoses:  1. Primary osteoarthritis of left knee   2. S/P total knee arthroplasty, left    Plan: Patient's flexing past 90 Quad strength is significantly improved. He needs a little bit work on extension to get the last 5 full extension so he can lock his knee when he standing. He is immature with a rolling walker and will progress to a cane as his strength improves. I will recheck him in 4 weeks.  Follow-Up Instructions: Return in about 4 weeks (around 12/17/2016).   Orders:  Orders Placed This Encounter  Procedures  . XR Knee 1-2 Views Left   No orders of the defined types were placed in this encounter.   Imaging: Xr Knee 1-2 Views Left  Result Date: 11/19/2016 Three-view x-rays left knee shows postop changes of total knee arthroplasty in good position and alignment. Good fit and fill. Good cement mantle. Impression: Satisfactory postop left total knee arthroplasty.   PMFS History: Patient Active Problem List   Diagnosis Date Noted  . Arthritis of left knee 11/04/2016  . Primary osteoarthritis of left knee    Past Medical History:  Diagnosis Date  . Arthritis   . Depression   . Hypercholesteremia   . Hypertension     No family history on file.  Past Surgical History:  Procedure Laterality Date  . KNEE ARTHROSCOPY Left   . TOTAL KNEE ARTHROPLASTY Left 11/04/2016   Procedure: LEFT TOTAL KNEE ARTHROPLASTY;  Surgeon: Eldred Manges, MD;  Location: [facility] OR;  Service: Orthopedics;  Laterality: Left;   Social History   Occupational History  . Not on file.   Social History Main Topics  . Smoking status: Never  Smoker  . Smokeless tobacco: Never Used  . Alcohol use No  . Drug use: No  . Sexual activity: Not on file

## 2016-11-27 ENCOUNTER — Other Ambulatory Visit (INDEPENDENT_AMBULATORY_CARE_PROVIDER_SITE_OTHER): Payer: Self-pay | Admitting: Orthopaedic Surgery

## 2016-12-02 ENCOUNTER — Telehealth (INDEPENDENT_AMBULATORY_CARE_PROVIDER_SITE_OTHER): Payer: Self-pay | Admitting: Orthopaedic Surgery

## 2016-12-02 NOTE — Telephone Encounter (Signed)
PT REQUESTED A NOTE TO BE EXCUSED FROM JURY DUTY FOR THE WEEK BEGINNING  6/11 PLEASE. WANTS TO PICK UP IN [place] OFFICE.   [phone]

## 2016-12-03 NOTE — Telephone Encounter (Signed)
Per Zonia KiefJames Owens, PA-C, okay for note. Note completed and signed. [facility] Steward DroneBrenda to print out and patient to pick up.

## 2016-12-17 ENCOUNTER — Ambulatory Visit (INDEPENDENT_AMBULATORY_CARE_PROVIDER_SITE_OTHER): Payer: Medicare Other | Admitting: Orthopaedic Surgery

## 2016-12-17 ENCOUNTER — Encounter (INDEPENDENT_AMBULATORY_CARE_PROVIDER_SITE_OTHER): Payer: Self-pay | Admitting: Orthopaedic Surgery

## 2016-12-17 VITALS — BP 146/91 | HR 66 | Ht 69.0 in | Wt 218.0 lb

## 2016-12-17 DIAGNOSIS — Z96652 Presence of left artificial knee joint: Secondary | ICD-10-CM

## 2016-12-17 NOTE — Progress Notes (Signed)
   Post-Op Visit Note   Patient: Joshua Reyes           Date of Birth: [birth year]           MRN: [id number] Visit Date: 12/17/2016 PCP: Ignatius SpeckingVyas, Dhruv B, MD   Assessment & Plan: Post left total knee arthroplasty on 11/04/2016.  Chief Complaint:  Chief Complaint  Patient presents with  . Left Knee - Routine Post Op   Visit Diagnoses:  1. S/P total knee arthroplasty, left     Plan: Patient is doing home exercises is flexing the 120. He lacks about 5 reaching full extension we gave him additional exercises to work on this. Additional exercise for quad strengthening. He can resume regular work on 01/04/2017 where he works at [employer] about 4 hours a day. I will recheck him in 3 months. He is very happy with this progress and pain relief. Follow-Up Instructions: Return in about 3 months (around 03/19/2017).   Orders:  No orders of the defined types were placed in this encounter.  No orders of the defined types were placed in this encounter.   Imaging: No results found.  PMFS History: Patient Active Problem List   Diagnosis Date Noted  . Arthritis of left knee 11/04/2016  . Primary osteoarthritis of left knee    Past Medical History:  Diagnosis Date  . Arthritis   . Depression   . Hypercholesteremia   . Hypertension     No family history on file.  Past Surgical History:  Procedure Laterality Date  . KNEE ARTHROSCOPY Left   . TOTAL KNEE ARTHROPLASTY Left 11/04/2016   Procedure: LEFT TOTAL KNEE ARTHROPLASTY;  Surgeon: Eldred MangesMark C Jeevan Kalla, MD;  Location: [facility] OR;  Service: Orthopedics;  Laterality: Left;   Social History   Occupational History  . Not on file.   Social History Main Topics  . Smoking status: Never Smoker  . Smokeless tobacco: Never Used  . Alcohol use No  . Drug use: No  . Sexual activity: Not on file

## 2019-03-13 IMAGING — DX DG KNEE 1-2V*L*
2 series · 2 of 2 positions shown · non-contrast
Comparison: Preoperative study October 30, 2014

CLINICAL DATA: Status post left total knee joint prosthesis
placement.

EXAM:
LEFT KNEE - 1-2 VIEW

[knee ap]
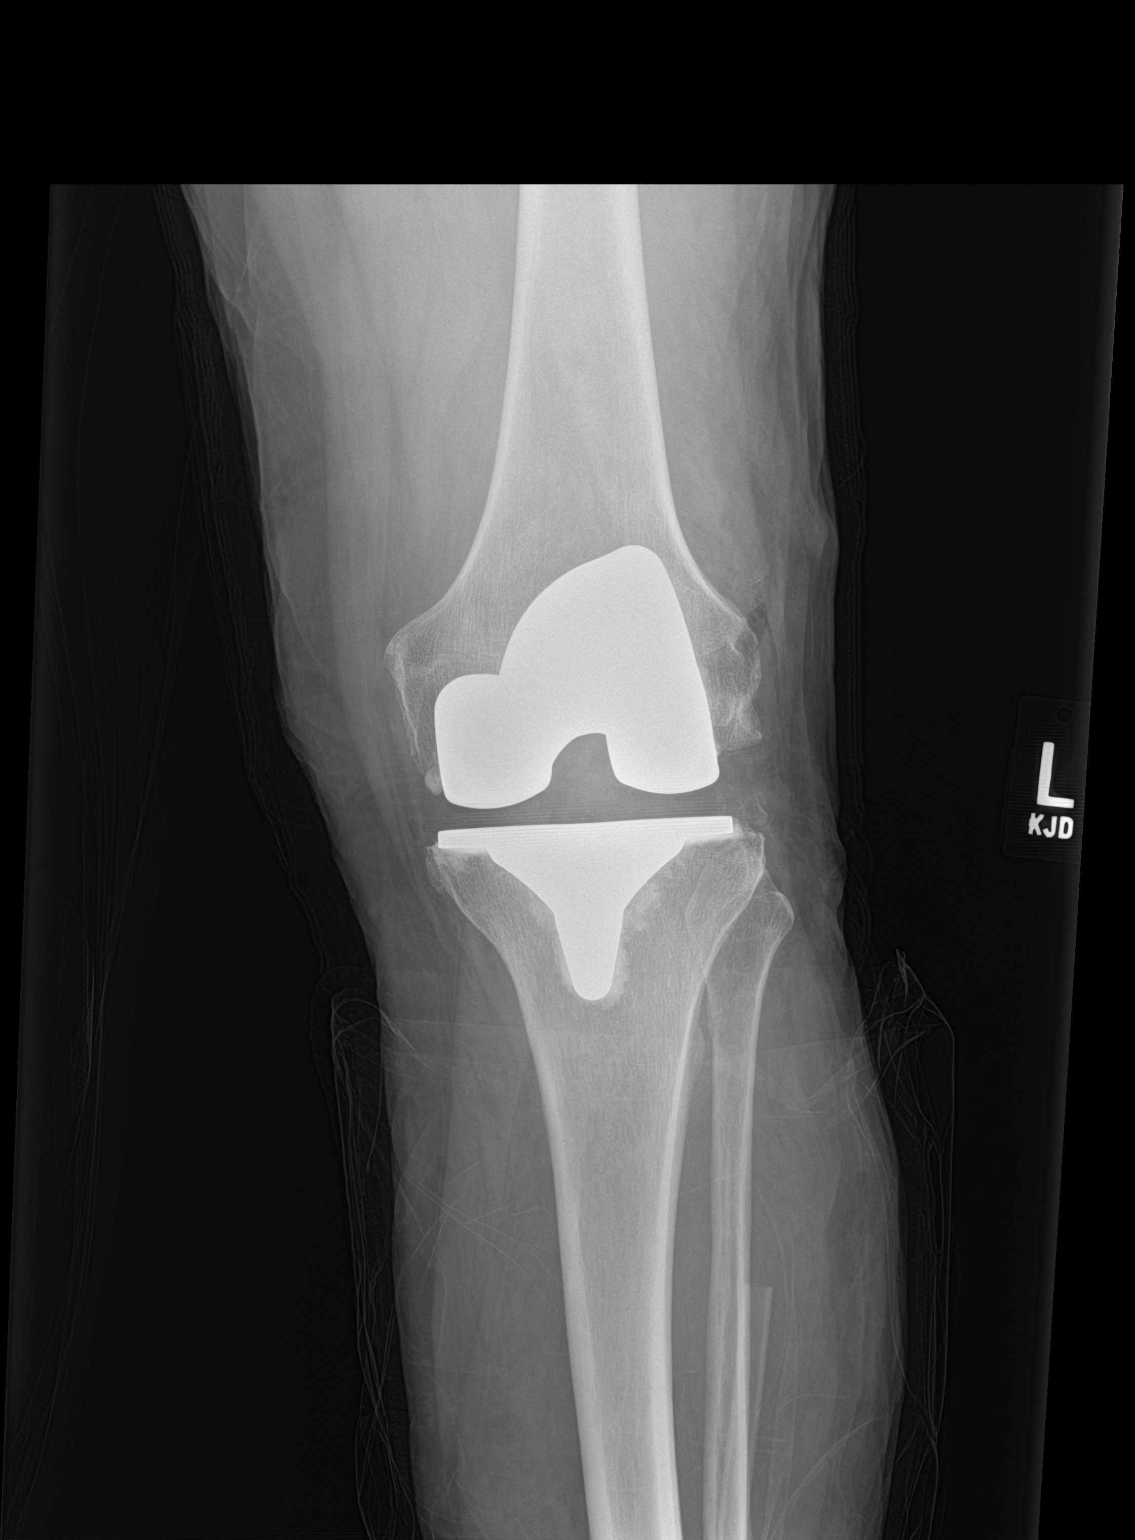

[knee lat]
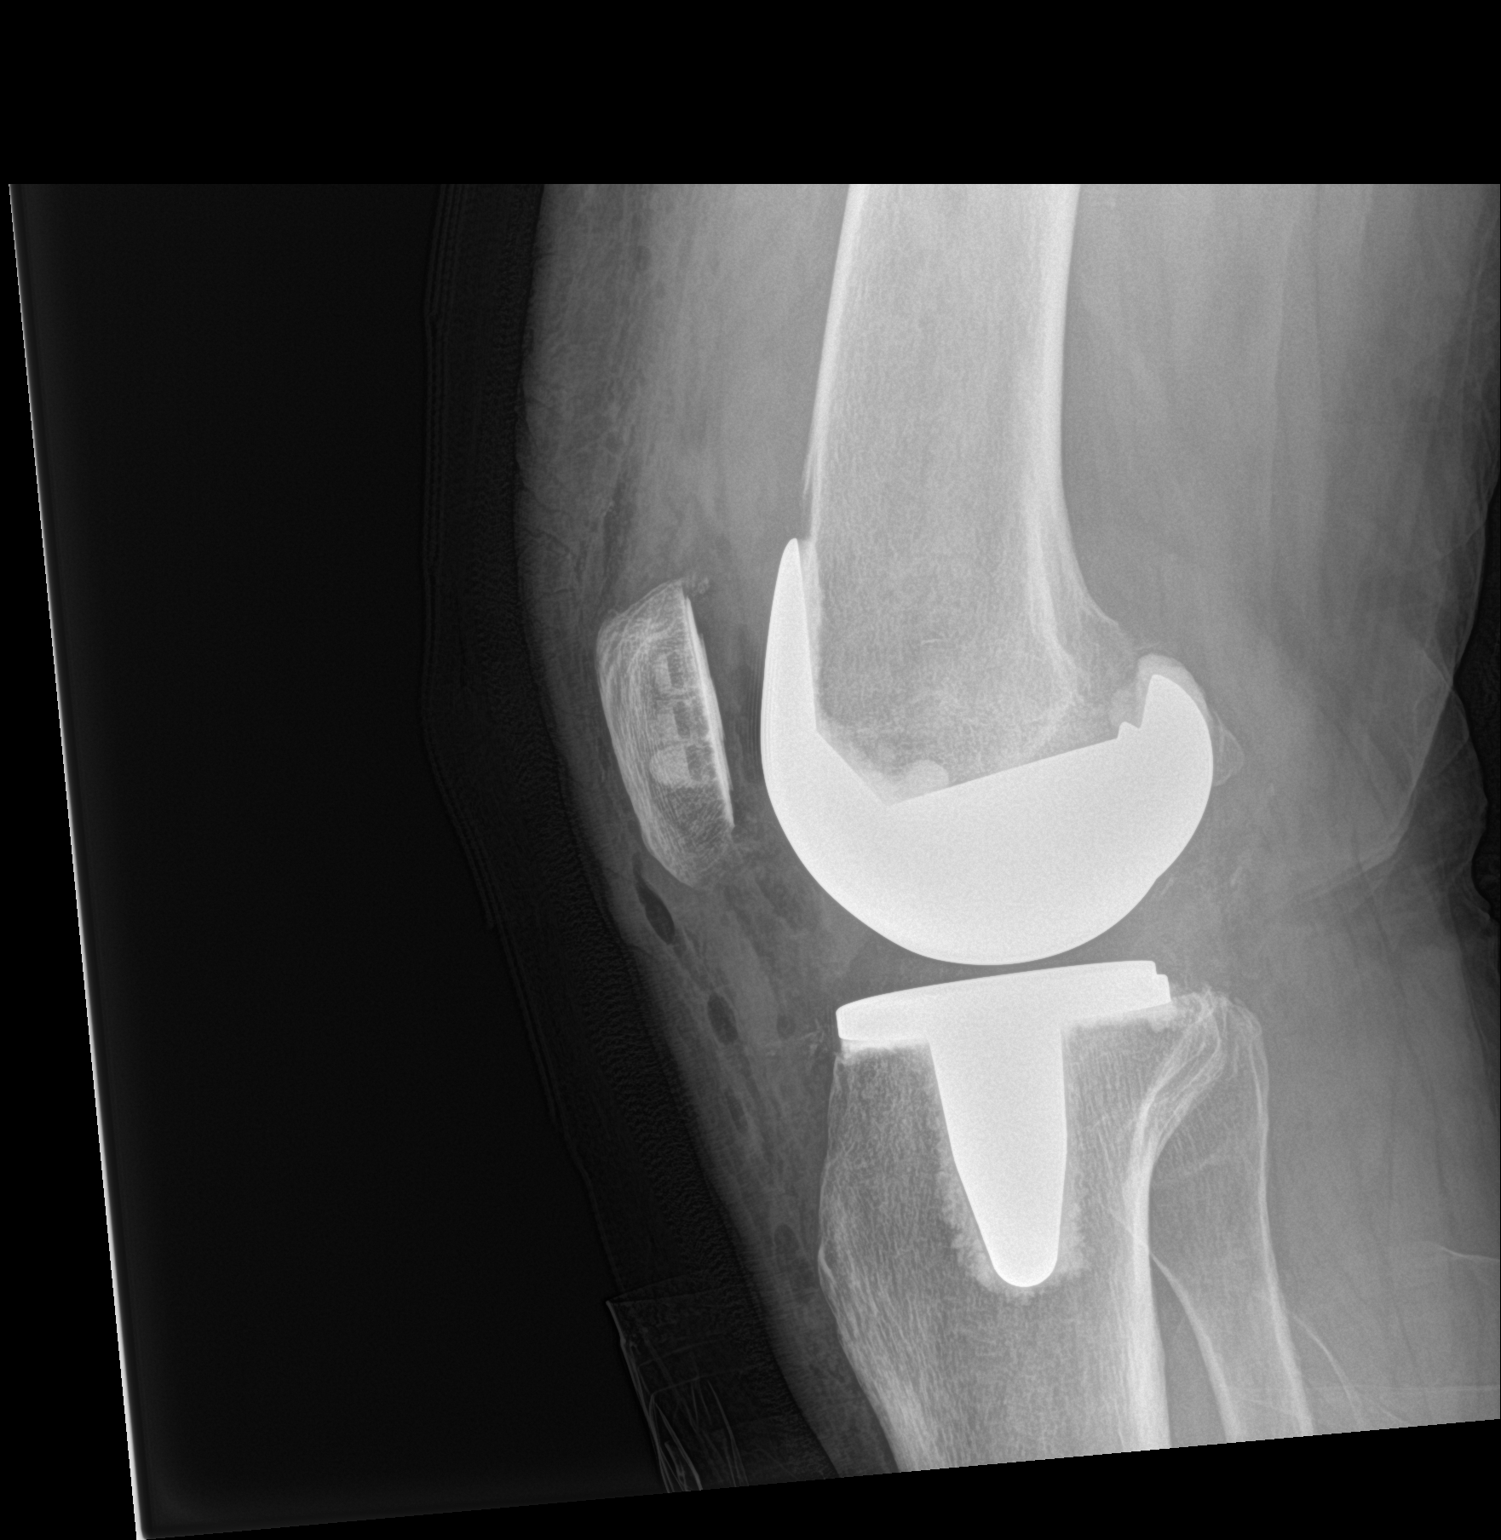

[2 of 2 positions shown; findings below may reference images not displayed]

FINDINGS: The patient has undergone total left knee joint prosthesis
placement. Radiographic positioning of the prosthetic components is
good. The interface with the native bone is normal. No acute native
bone abnormality is observed. There is a small amount of gas within
the anterior soft tissues.
IMPRESSION: There is no immediate postprocedure complication following left
total knee joint prosthesis placement.

## 2019-09-01 DIAGNOSIS — I10 Essential (primary) hypertension: Secondary | ICD-10-CM | POA: Diagnosis not present

## 2019-09-01 DIAGNOSIS — E1165 Type 2 diabetes mellitus with hyperglycemia: Secondary | ICD-10-CM | POA: Diagnosis not present

## 2019-09-01 DIAGNOSIS — Z299 Encounter for prophylactic measures, unspecified: Secondary | ICD-10-CM | POA: Diagnosis not present

## 2019-09-01 DIAGNOSIS — Z2821 Immunization not carried out because of patient refusal: Secondary | ICD-10-CM | POA: Diagnosis not present

## 2019-09-01 DIAGNOSIS — Z789 Other specified health status: Secondary | ICD-10-CM | POA: Diagnosis not present

## 2019-09-01 DIAGNOSIS — D692 Other nonthrombocytopenic purpura: Secondary | ICD-10-CM | POA: Diagnosis not present

## 2019-09-13 DIAGNOSIS — M79671 Pain in right foot: Secondary | ICD-10-CM | POA: Diagnosis not present

## 2019-09-13 DIAGNOSIS — M25775 Osteophyte, left foot: Secondary | ICD-10-CM | POA: Diagnosis not present

## 2019-09-13 DIAGNOSIS — M79672 Pain in left foot: Secondary | ICD-10-CM | POA: Diagnosis not present

## 2019-09-13 DIAGNOSIS — M25774 Osteophyte, right foot: Secondary | ICD-10-CM | POA: Diagnosis not present

## 2019-12-05 DIAGNOSIS — Z299 Encounter for prophylactic measures, unspecified: Secondary | ICD-10-CM | POA: Diagnosis not present

## 2019-12-05 DIAGNOSIS — Z7189 Other specified counseling: Secondary | ICD-10-CM | POA: Diagnosis not present

## 2019-12-05 DIAGNOSIS — E1165 Type 2 diabetes mellitus with hyperglycemia: Secondary | ICD-10-CM | POA: Diagnosis not present

## 2019-12-05 DIAGNOSIS — I10 Essential (primary) hypertension: Secondary | ICD-10-CM | POA: Diagnosis not present

## 2019-12-05 DIAGNOSIS — R5383 Other fatigue: Secondary | ICD-10-CM | POA: Diagnosis not present

## 2019-12-05 DIAGNOSIS — Z0000 Encounter for general adult medical examination without abnormal findings: Secondary | ICD-10-CM | POA: Diagnosis not present

## 2019-12-05 DIAGNOSIS — E785 Hyperlipidemia, unspecified: Secondary | ICD-10-CM | POA: Diagnosis not present

## 2019-12-05 DIAGNOSIS — Z79899 Other long term (current) drug therapy: Secondary | ICD-10-CM | POA: Diagnosis not present

## 2020-02-28 DIAGNOSIS — I10 Essential (primary) hypertension: Secondary | ICD-10-CM | POA: Diagnosis not present

## 2020-02-28 DIAGNOSIS — E1165 Type 2 diabetes mellitus with hyperglycemia: Secondary | ICD-10-CM | POA: Diagnosis not present

## 2020-02-28 DIAGNOSIS — Z299 Encounter for prophylactic measures, unspecified: Secondary | ICD-10-CM | POA: Diagnosis not present

## 2020-02-28 DIAGNOSIS — E781 Pure hyperglyceridemia: Secondary | ICD-10-CM | POA: Diagnosis not present

## 2020-04-22 DIAGNOSIS — E1165 Type 2 diabetes mellitus with hyperglycemia: Secondary | ICD-10-CM | POA: Diagnosis not present

## 2020-04-22 DIAGNOSIS — I10 Essential (primary) hypertension: Secondary | ICD-10-CM | POA: Diagnosis not present

## 2020-04-22 DIAGNOSIS — Z299 Encounter for prophylactic measures, unspecified: Secondary | ICD-10-CM | POA: Diagnosis not present

## 2020-04-22 DIAGNOSIS — E119 Type 2 diabetes mellitus without complications: Secondary | ICD-10-CM | POA: Diagnosis not present

## 2020-07-29 DIAGNOSIS — Z299 Encounter for prophylactic measures, unspecified: Secondary | ICD-10-CM | POA: Diagnosis not present

## 2020-07-29 DIAGNOSIS — Z789 Other specified health status: Secondary | ICD-10-CM | POA: Diagnosis not present

## 2020-07-29 DIAGNOSIS — E1165 Type 2 diabetes mellitus with hyperglycemia: Secondary | ICD-10-CM | POA: Diagnosis not present

## 2020-07-29 DIAGNOSIS — M542 Cervicalgia: Secondary | ICD-10-CM | POA: Diagnosis not present

## 2020-07-29 DIAGNOSIS — I10 Essential (primary) hypertension: Secondary | ICD-10-CM | POA: Diagnosis not present

## 2020-08-14 DIAGNOSIS — E1165 Type 2 diabetes mellitus with hyperglycemia: Secondary | ICD-10-CM | POA: Diagnosis not present

## 2020-08-14 DIAGNOSIS — J329 Chronic sinusitis, unspecified: Secondary | ICD-10-CM | POA: Diagnosis not present

## 2020-08-14 DIAGNOSIS — Z299 Encounter for prophylactic measures, unspecified: Secondary | ICD-10-CM | POA: Diagnosis not present

## 2020-08-14 DIAGNOSIS — I10 Essential (primary) hypertension: Secondary | ICD-10-CM | POA: Diagnosis not present

## 2020-12-11 DIAGNOSIS — I10 Essential (primary) hypertension: Secondary | ICD-10-CM | POA: Diagnosis not present

## 2020-12-11 DIAGNOSIS — Z1211 Encounter for screening for malignant neoplasm of colon: Secondary | ICD-10-CM | POA: Diagnosis not present

## 2020-12-11 DIAGNOSIS — E1165 Type 2 diabetes mellitus with hyperglycemia: Secondary | ICD-10-CM | POA: Diagnosis not present

## 2020-12-11 DIAGNOSIS — Z79899 Other long term (current) drug therapy: Secondary | ICD-10-CM | POA: Diagnosis not present

## 2020-12-11 DIAGNOSIS — Z7189 Other specified counseling: Secondary | ICD-10-CM | POA: Diagnosis not present

## 2020-12-11 DIAGNOSIS — Z0000 Encounter for general adult medical examination without abnormal findings: Secondary | ICD-10-CM | POA: Diagnosis not present

## 2020-12-11 DIAGNOSIS — Z299 Encounter for prophylactic measures, unspecified: Secondary | ICD-10-CM | POA: Diagnosis not present

## 2020-12-11 DIAGNOSIS — E7800 Pure hypercholesterolemia, unspecified: Secondary | ICD-10-CM | POA: Diagnosis not present

## 2020-12-11 DIAGNOSIS — R5383 Other fatigue: Secondary | ICD-10-CM | POA: Diagnosis not present

## 2020-12-17 DIAGNOSIS — M9902 Segmental and somatic dysfunction of thoracic region: Secondary | ICD-10-CM | POA: Diagnosis not present

## 2020-12-17 DIAGNOSIS — M9901 Segmental and somatic dysfunction of cervical region: Secondary | ICD-10-CM | POA: Diagnosis not present

## 2020-12-17 DIAGNOSIS — S233XXA Sprain of ligaments of thoracic spine, initial encounter: Secondary | ICD-10-CM | POA: Diagnosis not present

## 2020-12-17 DIAGNOSIS — S134XXA Sprain of ligaments of cervical spine, initial encounter: Secondary | ICD-10-CM | POA: Diagnosis not present

## 2020-12-20 DIAGNOSIS — S233XXA Sprain of ligaments of thoracic spine, initial encounter: Secondary | ICD-10-CM | POA: Diagnosis not present

## 2020-12-20 DIAGNOSIS — S134XXA Sprain of ligaments of cervical spine, initial encounter: Secondary | ICD-10-CM | POA: Diagnosis not present

## 2020-12-20 DIAGNOSIS — M9902 Segmental and somatic dysfunction of thoracic region: Secondary | ICD-10-CM | POA: Diagnosis not present

## 2020-12-20 DIAGNOSIS — M9901 Segmental and somatic dysfunction of cervical region: Secondary | ICD-10-CM | POA: Diagnosis not present

## 2020-12-26 DIAGNOSIS — M9901 Segmental and somatic dysfunction of cervical region: Secondary | ICD-10-CM | POA: Diagnosis not present

## 2020-12-26 DIAGNOSIS — S134XXA Sprain of ligaments of cervical spine, initial encounter: Secondary | ICD-10-CM | POA: Diagnosis not present

## 2020-12-26 DIAGNOSIS — M9902 Segmental and somatic dysfunction of thoracic region: Secondary | ICD-10-CM | POA: Diagnosis not present

## 2020-12-26 DIAGNOSIS — S233XXA Sprain of ligaments of thoracic spine, initial encounter: Secondary | ICD-10-CM | POA: Diagnosis not present

## 2021-01-06 DIAGNOSIS — M9902 Segmental and somatic dysfunction of thoracic region: Secondary | ICD-10-CM | POA: Diagnosis not present

## 2021-01-06 DIAGNOSIS — M9901 Segmental and somatic dysfunction of cervical region: Secondary | ICD-10-CM | POA: Diagnosis not present

## 2021-01-06 DIAGNOSIS — S233XXA Sprain of ligaments of thoracic spine, initial encounter: Secondary | ICD-10-CM | POA: Diagnosis not present

## 2021-01-06 DIAGNOSIS — S134XXA Sprain of ligaments of cervical spine, initial encounter: Secondary | ICD-10-CM | POA: Diagnosis not present

## 2021-04-02 DIAGNOSIS — I10 Essential (primary) hypertension: Secondary | ICD-10-CM | POA: Diagnosis not present

## 2021-04-02 DIAGNOSIS — Z299 Encounter for prophylactic measures, unspecified: Secondary | ICD-10-CM | POA: Diagnosis not present

## 2021-04-02 DIAGNOSIS — E1165 Type 2 diabetes mellitus with hyperglycemia: Secondary | ICD-10-CM | POA: Diagnosis not present

## 2021-04-02 DIAGNOSIS — D692 Other nonthrombocytopenic purpura: Secondary | ICD-10-CM | POA: Diagnosis not present

## 2021-06-17 DIAGNOSIS — I10 Essential (primary) hypertension: Secondary | ICD-10-CM | POA: Diagnosis not present

## 2021-06-17 DIAGNOSIS — Z299 Encounter for prophylactic measures, unspecified: Secondary | ICD-10-CM | POA: Diagnosis not present

## 2021-06-17 DIAGNOSIS — E7800 Pure hypercholesterolemia, unspecified: Secondary | ICD-10-CM | POA: Diagnosis not present

## 2021-06-17 DIAGNOSIS — J069 Acute upper respiratory infection, unspecified: Secondary | ICD-10-CM | POA: Diagnosis not present

## 2021-07-02 DIAGNOSIS — E119 Type 2 diabetes mellitus without complications: Secondary | ICD-10-CM | POA: Diagnosis not present

## 2021-08-06 DIAGNOSIS — E1165 Type 2 diabetes mellitus with hyperglycemia: Secondary | ICD-10-CM | POA: Diagnosis not present

## 2021-08-06 DIAGNOSIS — Z789 Other specified health status: Secondary | ICD-10-CM | POA: Diagnosis not present

## 2021-08-06 DIAGNOSIS — I10 Essential (primary) hypertension: Secondary | ICD-10-CM | POA: Diagnosis not present

## 2021-08-06 DIAGNOSIS — Z299 Encounter for prophylactic measures, unspecified: Secondary | ICD-10-CM | POA: Diagnosis not present

## 2021-08-06 DIAGNOSIS — D692 Other nonthrombocytopenic purpura: Secondary | ICD-10-CM | POA: Diagnosis not present

## 2021-09-01 DIAGNOSIS — Z789 Other specified health status: Secondary | ICD-10-CM | POA: Diagnosis not present

## 2021-09-01 DIAGNOSIS — Z1211 Encounter for screening for malignant neoplasm of colon: Secondary | ICD-10-CM | POA: Diagnosis not present

## 2021-09-01 DIAGNOSIS — I10 Essential (primary) hypertension: Secondary | ICD-10-CM | POA: Diagnosis not present

## 2021-09-01 DIAGNOSIS — Z299 Encounter for prophylactic measures, unspecified: Secondary | ICD-10-CM | POA: Diagnosis not present

## 2021-09-01 DIAGNOSIS — Z7189 Other specified counseling: Secondary | ICD-10-CM | POA: Diagnosis not present

## 2021-09-01 DIAGNOSIS — Z0000 Encounter for general adult medical examination without abnormal findings: Secondary | ICD-10-CM | POA: Diagnosis not present

## 2021-09-01 DIAGNOSIS — E1165 Type 2 diabetes mellitus with hyperglycemia: Secondary | ICD-10-CM | POA: Diagnosis not present

## 2021-09-11 DIAGNOSIS — I10 Essential (primary) hypertension: Secondary | ICD-10-CM | POA: Diagnosis not present

## 2021-09-11 DIAGNOSIS — Z789 Other specified health status: Secondary | ICD-10-CM | POA: Diagnosis not present

## 2021-09-11 DIAGNOSIS — Z299 Encounter for prophylactic measures, unspecified: Secondary | ICD-10-CM | POA: Diagnosis not present

## 2021-09-11 DIAGNOSIS — Z2821 Immunization not carried out because of patient refusal: Secondary | ICD-10-CM | POA: Diagnosis not present

## 2021-09-11 DIAGNOSIS — J069 Acute upper respiratory infection, unspecified: Secondary | ICD-10-CM | POA: Diagnosis not present

## 2021-11-11 DIAGNOSIS — I10 Essential (primary) hypertension: Secondary | ICD-10-CM | POA: Diagnosis not present

## 2021-11-11 DIAGNOSIS — Z299 Encounter for prophylactic measures, unspecified: Secondary | ICD-10-CM | POA: Diagnosis not present

## 2021-11-11 DIAGNOSIS — E1165 Type 2 diabetes mellitus with hyperglycemia: Secondary | ICD-10-CM | POA: Diagnosis not present

## 2021-11-11 DIAGNOSIS — Z713 Dietary counseling and surveillance: Secondary | ICD-10-CM | POA: Diagnosis not present

## 2021-12-01 DIAGNOSIS — Z1212 Encounter for screening for malignant neoplasm of rectum: Secondary | ICD-10-CM | POA: Diagnosis not present

## 2021-12-01 DIAGNOSIS — Z1211 Encounter for screening for malignant neoplasm of colon: Secondary | ICD-10-CM | POA: Diagnosis not present

## 2021-12-18 DIAGNOSIS — Z79899 Other long term (current) drug therapy: Secondary | ICD-10-CM | POA: Diagnosis not present

## 2021-12-18 DIAGNOSIS — E7800 Pure hypercholesterolemia, unspecified: Secondary | ICD-10-CM | POA: Diagnosis not present

## 2021-12-18 DIAGNOSIS — Z299 Encounter for prophylactic measures, unspecified: Secondary | ICD-10-CM | POA: Diagnosis not present

## 2021-12-18 DIAGNOSIS — I10 Essential (primary) hypertension: Secondary | ICD-10-CM | POA: Diagnosis not present

## 2021-12-18 DIAGNOSIS — Z0000 Encounter for general adult medical examination without abnormal findings: Secondary | ICD-10-CM | POA: Diagnosis not present

## 2021-12-18 DIAGNOSIS — R5383 Other fatigue: Secondary | ICD-10-CM | POA: Diagnosis not present

## 2022-02-17 DIAGNOSIS — I10 Essential (primary) hypertension: Secondary | ICD-10-CM | POA: Diagnosis not present

## 2022-02-17 DIAGNOSIS — I152 Hypertension secondary to endocrine disorders: Secondary | ICD-10-CM | POA: Diagnosis not present

## 2022-02-17 DIAGNOSIS — Z299 Encounter for prophylactic measures, unspecified: Secondary | ICD-10-CM | POA: Diagnosis not present

## 2022-02-17 DIAGNOSIS — E1159 Type 2 diabetes mellitus with other circulatory complications: Secondary | ICD-10-CM | POA: Diagnosis not present

## 2022-06-15 DIAGNOSIS — I152 Hypertension secondary to endocrine disorders: Secondary | ICD-10-CM | POA: Diagnosis not present

## 2022-06-15 DIAGNOSIS — I10 Essential (primary) hypertension: Secondary | ICD-10-CM | POA: Diagnosis not present

## 2022-06-15 DIAGNOSIS — E1159 Type 2 diabetes mellitus with other circulatory complications: Secondary | ICD-10-CM | POA: Diagnosis not present

## 2022-06-15 DIAGNOSIS — Z299 Encounter for prophylactic measures, unspecified: Secondary | ICD-10-CM | POA: Diagnosis not present

## 2022-06-15 DIAGNOSIS — Z2821 Immunization not carried out because of patient refusal: Secondary | ICD-10-CM | POA: Diagnosis not present

## 2022-07-09 DIAGNOSIS — L821 Other seborrheic keratosis: Secondary | ICD-10-CM | POA: Diagnosis not present

## 2022-07-09 DIAGNOSIS — I10 Essential (primary) hypertension: Secondary | ICD-10-CM | POA: Diagnosis not present

## 2022-07-09 DIAGNOSIS — Z299 Encounter for prophylactic measures, unspecified: Secondary | ICD-10-CM | POA: Diagnosis not present

## 2022-07-30 DIAGNOSIS — R5383 Other fatigue: Secondary | ICD-10-CM | POA: Diagnosis not present

## 2022-07-30 DIAGNOSIS — K2900 Acute gastritis without bleeding: Secondary | ICD-10-CM | POA: Diagnosis not present

## 2022-07-30 DIAGNOSIS — Z299 Encounter for prophylactic measures, unspecified: Secondary | ICD-10-CM | POA: Diagnosis not present

## 2022-09-08 DIAGNOSIS — D692 Other nonthrombocytopenic purpura: Secondary | ICD-10-CM | POA: Diagnosis not present

## 2022-09-08 DIAGNOSIS — Z0000 Encounter for general adult medical examination without abnormal findings: Secondary | ICD-10-CM | POA: Diagnosis not present

## 2022-09-08 DIAGNOSIS — E1165 Type 2 diabetes mellitus with hyperglycemia: Secondary | ICD-10-CM | POA: Diagnosis not present

## 2022-09-08 DIAGNOSIS — Z299 Encounter for prophylactic measures, unspecified: Secondary | ICD-10-CM | POA: Diagnosis not present

## 2022-09-08 DIAGNOSIS — Z7189 Other specified counseling: Secondary | ICD-10-CM | POA: Diagnosis not present

## 2022-09-08 DIAGNOSIS — I10 Essential (primary) hypertension: Secondary | ICD-10-CM | POA: Diagnosis not present

## 2022-09-21 DIAGNOSIS — E119 Type 2 diabetes mellitus without complications: Secondary | ICD-10-CM | POA: Diagnosis not present

## 2022-09-22 DIAGNOSIS — Z299 Encounter for prophylactic measures, unspecified: Secondary | ICD-10-CM | POA: Diagnosis not present

## 2022-09-22 DIAGNOSIS — E119 Type 2 diabetes mellitus without complications: Secondary | ICD-10-CM | POA: Diagnosis not present

## 2022-09-22 DIAGNOSIS — I10 Essential (primary) hypertension: Secondary | ICD-10-CM | POA: Diagnosis not present

## 2022-10-21 DIAGNOSIS — M1990 Unspecified osteoarthritis, unspecified site: Secondary | ICD-10-CM | POA: Diagnosis not present

## 2022-10-21 DIAGNOSIS — Z299 Encounter for prophylactic measures, unspecified: Secondary | ICD-10-CM | POA: Diagnosis not present

## 2022-10-21 DIAGNOSIS — E119 Type 2 diabetes mellitus without complications: Secondary | ICD-10-CM | POA: Diagnosis not present

## 2022-10-21 DIAGNOSIS — I10 Essential (primary) hypertension: Secondary | ICD-10-CM | POA: Diagnosis not present

## 2023-01-01 DIAGNOSIS — E781 Pure hyperglyceridemia: Secondary | ICD-10-CM | POA: Diagnosis not present

## 2023-01-01 DIAGNOSIS — R5383 Other fatigue: Secondary | ICD-10-CM | POA: Diagnosis not present

## 2023-01-01 DIAGNOSIS — I10 Essential (primary) hypertension: Secondary | ICD-10-CM | POA: Diagnosis not present

## 2023-01-01 DIAGNOSIS — Z79899 Other long term (current) drug therapy: Secondary | ICD-10-CM | POA: Diagnosis not present

## 2023-01-01 DIAGNOSIS — Z299 Encounter for prophylactic measures, unspecified: Secondary | ICD-10-CM | POA: Diagnosis not present

## 2023-01-01 DIAGNOSIS — Z0000 Encounter for general adult medical examination without abnormal findings: Secondary | ICD-10-CM | POA: Diagnosis not present

## 2023-01-01 DIAGNOSIS — Z87891 Personal history of nicotine dependence: Secondary | ICD-10-CM | POA: Diagnosis not present

## 2023-01-05 DIAGNOSIS — Z299 Encounter for prophylactic measures, unspecified: Secondary | ICD-10-CM | POA: Diagnosis not present

## 2023-01-05 DIAGNOSIS — I10 Essential (primary) hypertension: Secondary | ICD-10-CM | POA: Diagnosis not present

## 2023-01-05 DIAGNOSIS — E1165 Type 2 diabetes mellitus with hyperglycemia: Secondary | ICD-10-CM | POA: Diagnosis not present

## 2023-03-03 DIAGNOSIS — Z299 Encounter for prophylactic measures, unspecified: Secondary | ICD-10-CM | POA: Diagnosis not present

## 2023-03-03 DIAGNOSIS — U071 COVID-19: Secondary | ICD-10-CM | POA: Diagnosis not present

## 2023-03-03 DIAGNOSIS — R5383 Other fatigue: Secondary | ICD-10-CM | POA: Diagnosis not present

## 2023-03-03 DIAGNOSIS — Z20822 Contact with and (suspected) exposure to covid-19: Secondary | ICD-10-CM | POA: Diagnosis not present

## 2023-04-26 DIAGNOSIS — Z299 Encounter for prophylactic measures, unspecified: Secondary | ICD-10-CM | POA: Diagnosis not present

## 2023-04-26 DIAGNOSIS — E119 Type 2 diabetes mellitus without complications: Secondary | ICD-10-CM | POA: Diagnosis not present

## 2023-04-26 DIAGNOSIS — E1165 Type 2 diabetes mellitus with hyperglycemia: Secondary | ICD-10-CM | POA: Diagnosis not present

## 2023-04-26 DIAGNOSIS — I10 Essential (primary) hypertension: Secondary | ICD-10-CM | POA: Diagnosis not present

## 2023-04-26 DIAGNOSIS — Z2821 Immunization not carried out because of patient refusal: Secondary | ICD-10-CM | POA: Diagnosis not present

## 2023-08-10 DIAGNOSIS — E1165 Type 2 diabetes mellitus with hyperglycemia: Secondary | ICD-10-CM | POA: Diagnosis not present

## 2023-08-10 DIAGNOSIS — D692 Other nonthrombocytopenic purpura: Secondary | ICD-10-CM | POA: Diagnosis not present

## 2023-08-10 DIAGNOSIS — Z299 Encounter for prophylactic measures, unspecified: Secondary | ICD-10-CM | POA: Diagnosis not present

## 2023-08-10 DIAGNOSIS — E119 Type 2 diabetes mellitus without complications: Secondary | ICD-10-CM | POA: Diagnosis not present

## 2023-08-10 DIAGNOSIS — I10 Essential (primary) hypertension: Secondary | ICD-10-CM | POA: Diagnosis not present

## 2023-08-30 DIAGNOSIS — K529 Noninfective gastroenteritis and colitis, unspecified: Secondary | ICD-10-CM | POA: Diagnosis not present

## 2023-08-30 DIAGNOSIS — E1165 Type 2 diabetes mellitus with hyperglycemia: Secondary | ICD-10-CM | POA: Diagnosis not present

## 2023-08-30 DIAGNOSIS — R112 Nausea with vomiting, unspecified: Secondary | ICD-10-CM | POA: Diagnosis not present

## 2023-08-30 DIAGNOSIS — Z299 Encounter for prophylactic measures, unspecified: Secondary | ICD-10-CM | POA: Diagnosis not present

## 2023-09-24 DIAGNOSIS — Z299 Encounter for prophylactic measures, unspecified: Secondary | ICD-10-CM | POA: Diagnosis not present

## 2023-09-24 DIAGNOSIS — J329 Chronic sinusitis, unspecified: Secondary | ICD-10-CM | POA: Diagnosis not present

## 2023-09-24 DIAGNOSIS — R0981 Nasal congestion: Secondary | ICD-10-CM | POA: Diagnosis not present

## 2023-09-27 DIAGNOSIS — Z7189 Other specified counseling: Secondary | ICD-10-CM | POA: Diagnosis not present

## 2023-09-27 DIAGNOSIS — Z713 Dietary counseling and surveillance: Secondary | ICD-10-CM | POA: Diagnosis not present

## 2023-09-27 DIAGNOSIS — Z0000 Encounter for general adult medical examination without abnormal findings: Secondary | ICD-10-CM | POA: Diagnosis not present

## 2023-09-27 DIAGNOSIS — Z299 Encounter for prophylactic measures, unspecified: Secondary | ICD-10-CM | POA: Diagnosis not present

## 2023-09-27 DIAGNOSIS — I10 Essential (primary) hypertension: Secondary | ICD-10-CM | POA: Diagnosis not present

## 2023-11-16 DIAGNOSIS — I10 Essential (primary) hypertension: Secondary | ICD-10-CM | POA: Diagnosis not present

## 2023-11-16 DIAGNOSIS — E1165 Type 2 diabetes mellitus with hyperglycemia: Secondary | ICD-10-CM | POA: Diagnosis not present

## 2023-11-16 DIAGNOSIS — Z299 Encounter for prophylactic measures, unspecified: Secondary | ICD-10-CM | POA: Diagnosis not present

## 2024-01-04 DIAGNOSIS — R21 Rash and other nonspecific skin eruption: Secondary | ICD-10-CM | POA: Diagnosis not present

## 2024-01-04 DIAGNOSIS — Z299 Encounter for prophylactic measures, unspecified: Secondary | ICD-10-CM | POA: Diagnosis not present

## 2024-01-04 DIAGNOSIS — I10 Essential (primary) hypertension: Secondary | ICD-10-CM | POA: Diagnosis not present

## 2024-01-11 DIAGNOSIS — E7800 Pure hypercholesterolemia, unspecified: Secondary | ICD-10-CM | POA: Diagnosis not present

## 2024-01-11 DIAGNOSIS — R5383 Other fatigue: Secondary | ICD-10-CM | POA: Diagnosis not present

## 2024-01-11 DIAGNOSIS — I10 Essential (primary) hypertension: Secondary | ICD-10-CM | POA: Diagnosis not present

## 2024-01-11 DIAGNOSIS — Z299 Encounter for prophylactic measures, unspecified: Secondary | ICD-10-CM | POA: Diagnosis not present

## 2024-01-11 DIAGNOSIS — Z0000 Encounter for general adult medical examination without abnormal findings: Secondary | ICD-10-CM | POA: Diagnosis not present

## 2024-01-11 DIAGNOSIS — Z79899 Other long term (current) drug therapy: Secondary | ICD-10-CM | POA: Diagnosis not present

## 2024-01-11 DIAGNOSIS — D649 Anemia, unspecified: Secondary | ICD-10-CM | POA: Diagnosis not present

## 2024-01-12 DIAGNOSIS — R5383 Other fatigue: Secondary | ICD-10-CM | POA: Diagnosis not present

## 2024-01-14 DIAGNOSIS — E1169 Type 2 diabetes mellitus with other specified complication: Secondary | ICD-10-CM | POA: Diagnosis not present

## 2024-01-14 DIAGNOSIS — Z299 Encounter for prophylactic measures, unspecified: Secondary | ICD-10-CM | POA: Diagnosis not present

## 2024-01-14 DIAGNOSIS — I10 Essential (primary) hypertension: Secondary | ICD-10-CM | POA: Diagnosis not present

## 2024-02-15 DIAGNOSIS — I10 Essential (primary) hypertension: Secondary | ICD-10-CM | POA: Diagnosis not present

## 2024-02-15 DIAGNOSIS — R0781 Pleurodynia: Secondary | ICD-10-CM | POA: Diagnosis not present

## 2024-02-15 DIAGNOSIS — Z713 Dietary counseling and surveillance: Secondary | ICD-10-CM | POA: Diagnosis not present

## 2024-02-15 DIAGNOSIS — R0789 Other chest pain: Secondary | ICD-10-CM | POA: Diagnosis not present

## 2024-02-15 DIAGNOSIS — R079 Chest pain, unspecified: Secondary | ICD-10-CM | POA: Diagnosis not present

## 2024-02-15 DIAGNOSIS — R52 Pain, unspecified: Secondary | ICD-10-CM | POA: Diagnosis not present

## 2024-02-15 DIAGNOSIS — Z299 Encounter for prophylactic measures, unspecified: Secondary | ICD-10-CM | POA: Diagnosis not present

## 2024-03-21 DIAGNOSIS — I10 Essential (primary) hypertension: Secondary | ICD-10-CM | POA: Diagnosis not present

## 2024-03-21 DIAGNOSIS — Z299 Encounter for prophylactic measures, unspecified: Secondary | ICD-10-CM | POA: Diagnosis not present

## 2024-03-21 DIAGNOSIS — E119 Type 2 diabetes mellitus without complications: Secondary | ICD-10-CM | POA: Diagnosis not present

## 2024-03-21 DIAGNOSIS — R0789 Other chest pain: Secondary | ICD-10-CM | POA: Diagnosis not present

## 2024-04-13 DIAGNOSIS — I10 Essential (primary) hypertension: Secondary | ICD-10-CM | POA: Diagnosis not present

## 2024-04-13 DIAGNOSIS — J069 Acute upper respiratory infection, unspecified: Secondary | ICD-10-CM | POA: Diagnosis not present

## 2024-04-13 DIAGNOSIS — R093 Abnormal sputum: Secondary | ICD-10-CM | POA: Diagnosis not present

## 2024-04-17 DIAGNOSIS — E349 Endocrine disorder, unspecified: Secondary | ICD-10-CM | POA: Diagnosis not present

## 2024-05-01 DIAGNOSIS — Z299 Encounter for prophylactic measures, unspecified: Secondary | ICD-10-CM | POA: Diagnosis not present

## 2024-05-01 DIAGNOSIS — J309 Allergic rhinitis, unspecified: Secondary | ICD-10-CM | POA: Diagnosis not present

## 2024-05-01 DIAGNOSIS — R52 Pain, unspecified: Secondary | ICD-10-CM | POA: Diagnosis not present

## 2024-05-01 DIAGNOSIS — R070 Pain in throat: Secondary | ICD-10-CM | POA: Diagnosis not present

## 2024-05-01 DIAGNOSIS — Z2821 Immunization not carried out because of patient refusal: Secondary | ICD-10-CM | POA: Diagnosis not present

## 2024-05-01 DIAGNOSIS — I10 Essential (primary) hypertension: Secondary | ICD-10-CM | POA: Diagnosis not present

## 2024-05-04 DIAGNOSIS — E1159 Type 2 diabetes mellitus with other circulatory complications: Secondary | ICD-10-CM | POA: Diagnosis not present

## 2024-05-04 DIAGNOSIS — I152 Hypertension secondary to endocrine disorders: Secondary | ICD-10-CM | POA: Diagnosis not present

## 2024-05-04 DIAGNOSIS — Z299 Encounter for prophylactic measures, unspecified: Secondary | ICD-10-CM | POA: Diagnosis not present

## 2024-05-04 DIAGNOSIS — R5383 Other fatigue: Secondary | ICD-10-CM | POA: Diagnosis not present

## 2024-05-04 DIAGNOSIS — I10 Essential (primary) hypertension: Secondary | ICD-10-CM | POA: Diagnosis not present

## 2024-05-04 DIAGNOSIS — E349 Endocrine disorder, unspecified: Secondary | ICD-10-CM | POA: Diagnosis not present

## 2024-05-05 DIAGNOSIS — R5383 Other fatigue: Secondary | ICD-10-CM | POA: Diagnosis not present

## 2024-05-05 DIAGNOSIS — E349 Endocrine disorder, unspecified: Secondary | ICD-10-CM | POA: Diagnosis not present

## 2024-05-10 DIAGNOSIS — J029 Acute pharyngitis, unspecified: Secondary | ICD-10-CM | POA: Diagnosis not present

## 2024-05-10 DIAGNOSIS — R070 Pain in throat: Secondary | ICD-10-CM | POA: Diagnosis not present

## 2024-05-10 DIAGNOSIS — J040 Acute laryngitis: Secondary | ICD-10-CM | POA: Diagnosis not present

## 2024-05-10 DIAGNOSIS — Z299 Encounter for prophylactic measures, unspecified: Secondary | ICD-10-CM | POA: Diagnosis not present

## 2024-05-10 DIAGNOSIS — E119 Type 2 diabetes mellitus without complications: Secondary | ICD-10-CM | POA: Diagnosis not present
